# Patient Record
Sex: Male | Born: 1975
Health system: Southern US, Community
[De-identification: ages and names within clinical notes are randomized; demographics above are authoritative.]

## PROBLEM LIST (undated history)

## (undated) DIAGNOSIS — E119 Type 2 diabetes mellitus without complications: Secondary | ICD-10-CM

## (undated) DIAGNOSIS — I10 Essential (primary) hypertension: Secondary | ICD-10-CM

## (undated) HISTORY — PX: APPENDECTOMY: SHX54

## (undated) HISTORY — PX: KNEE ARTHROSCOPY: SUR90

## (undated) HISTORY — PX: FOOT SURGERY: SHX648

## (undated) HISTORY — PX: CHOLECYSTECTOMY: SHX55

---

## 1999-04-13 HISTORY — PX: ARTHROSCOPIC REPAIR ACL: SUR80

## 1999-10-05 ENCOUNTER — Ambulatory Visit (HOSPITAL_COMMUNITY): Admission: RE | Admit: 1999-10-05 | Discharge: 1999-10-05 | Payer: Self-pay | Admitting: Orthopedic Surgery

## 2003-10-04 ENCOUNTER — Ambulatory Visit (HOSPITAL_COMMUNITY): Admission: RE | Admit: 2003-10-04 | Discharge: 2003-10-04 | Payer: Self-pay | Admitting: Podiatry

## 2004-06-04 ENCOUNTER — Ambulatory Visit: Payer: Self-pay | Admitting: Cardiology

## 2010-08-24 DIAGNOSIS — R079 Chest pain, unspecified: Secondary | ICD-10-CM

## 2010-08-26 DIAGNOSIS — R072 Precordial pain: Secondary | ICD-10-CM

## 2012-09-22 DIAGNOSIS — R55 Syncope and collapse: Secondary | ICD-10-CM

## 2014-08-30 ENCOUNTER — Emergency Department (HOSPITAL_COMMUNITY)
Admission: EM | Admit: 2014-08-30 | Discharge: 2014-08-30 | Disposition: A | Discharge status: Home / Self Care | Payer: BLUE CROSS/BLUE SHIELD | Attending: Emergency Medicine | Admitting: Emergency Medicine

## 2014-08-30 ENCOUNTER — Encounter (HOSPITAL_COMMUNITY): Payer: Self-pay | Admitting: Emergency Medicine

## 2014-08-30 ENCOUNTER — Emergency Department (HOSPITAL_COMMUNITY): Payer: BLUE CROSS/BLUE SHIELD

## 2014-08-30 DIAGNOSIS — Z794 Long term (current) use of insulin: Secondary | ICD-10-CM | POA: Insufficient documentation

## 2014-08-30 DIAGNOSIS — R109 Unspecified abdominal pain: Secondary | ICD-10-CM | POA: Diagnosis present

## 2014-08-30 DIAGNOSIS — I10 Essential (primary) hypertension: Secondary | ICD-10-CM | POA: Insufficient documentation

## 2014-08-30 DIAGNOSIS — R110 Nausea: Secondary | ICD-10-CM | POA: Diagnosis not present

## 2014-08-30 DIAGNOSIS — E119 Type 2 diabetes mellitus without complications: Secondary | ICD-10-CM | POA: Insufficient documentation

## 2014-08-30 DIAGNOSIS — Z79899 Other long term (current) drug therapy: Secondary | ICD-10-CM | POA: Diagnosis not present

## 2014-08-30 DIAGNOSIS — Z720 Tobacco use: Secondary | ICD-10-CM | POA: Insufficient documentation

## 2014-08-30 DIAGNOSIS — M549 Dorsalgia, unspecified: Secondary | ICD-10-CM | POA: Insufficient documentation

## 2014-08-30 HISTORY — DX: Essential (primary) hypertension: I10

## 2014-08-30 HISTORY — DX: Type 2 diabetes mellitus without complications: E11.9

## 2014-08-30 LAB — CBC WITH DIFFERENTIAL/PLATELET
Basophils Absolute: 0 10*3/uL (ref 0.0–0.1)
Basophils Relative: 1 % (ref 0–1)
Eosinophils Absolute: 0.4 10*3/uL (ref 0.0–0.7)
Eosinophils Relative: 5 % (ref 0–5)
HCT: 41.9 % (ref 39.0–52.0)
Hemoglobin: 14.7 g/dL (ref 13.0–17.0)
Lymphocytes Relative: 27 % (ref 12–46)
Lymphs Abs: 2.2 10*3/uL (ref 0.7–4.0)
MCH: 30.5 pg (ref 26.0–34.0)
MCHC: 35.1 g/dL (ref 30.0–36.0)
MCV: 86.9 fL (ref 78.0–100.0)
Monocytes Absolute: 0.4 10*3/uL (ref 0.1–1.0)
Monocytes Relative: 5 % (ref 3–12)
Neutro Abs: 5 10*3/uL (ref 1.7–7.7)
Neutrophils Relative %: 62 % (ref 43–77)
Platelets: 269 10*3/uL (ref 150–400)
RBC: 4.82 MIL/uL (ref 4.22–5.81)
RDW: 12.5 % (ref 11.5–15.5)
WBC: 8 10*3/uL (ref 4.0–10.5)

## 2014-08-30 LAB — URINE MICROSCOPIC-ADD ON

## 2014-08-30 LAB — BASIC METABOLIC PANEL
Anion gap: 11 (ref 5–15)
BUN: 15 mg/dL (ref 6–20)
CO2: 26 mmol/L (ref 22–32)
Calcium: 9 mg/dL (ref 8.9–10.3)
Chloride: 100 mmol/L — ABNORMAL LOW (ref 101–111)
Creatinine, Ser: 0.96 mg/dL (ref 0.61–1.24)
GFR calc Af Amer: >60 mL/min (ref >60)
GFR calc non Af Amer: >60 mL/min (ref >60)
Glucose, Bld: 364 mg/dL — ABNORMAL HIGH (ref 65–99)
Potassium: 4 mmol/L (ref 3.5–5.1)
Sodium: 137 mmol/L (ref 135–145)

## 2014-08-30 LAB — URINALYSIS, ROUTINE W REFLEX MICROSCOPIC
Bilirubin Urine: NEGATIVE
Glucose, UA: >1000 mg/dL — AB
Hgb urine dipstick: NEGATIVE
Ketones, ur: 15 mg/dL — AB
Leukocytes, UA: NEGATIVE
Nitrite: NEGATIVE
Protein, ur: NEGATIVE mg/dL
Specific Gravity, Urine: 1.02 (ref 1.005–1.030)
Urobilinogen, UA: 0.2 mg/dL (ref 0.0–1.0)
pH: 6 (ref 5.0–8.0)

## 2014-08-30 LAB — CBG MONITORING, ED: Glucose-Capillary: 326 mg/dL — ABNORMAL HIGH (ref 65–99)

## 2014-08-30 MED ORDER — NAPROXEN 500 MG PO TABS: 500.0000 mg | ORAL_TABLET | Freq: Two times a day (BID) | ORAL | Status: DC

## 2014-08-30 MED ORDER — FENTANYL CITRATE (PF) 100 MCG/2ML IJ SOLN
50.0000 ug | Freq: Once | INTRAMUSCULAR | Status: AC
  Administered 2014-08-30: 50 ug via INTRAVENOUS
  Filled 2014-08-30: qty 2

## 2014-08-30 MED ORDER — SODIUM CHLORIDE 0.9 % IV BOLUS (SEPSIS)
500.0000 mL | Freq: Once | INTRAVENOUS | Status: AC
  Administered 2014-08-30: 500 mL via INTRAVENOUS

## 2014-08-30 MED ORDER — HYDROCODONE-ACETAMINOPHEN 5-325 MG PO TABS: 1.0000 | ORAL_TABLET | Freq: Four times a day (QID) | ORAL | Status: DC | PRN

## 2014-08-30 MED ORDER — ONDANSETRON HCL 4 MG/2ML IJ SOLN
4.0000 mg | Freq: Once | INTRAMUSCULAR | Status: AC
  Administered 2014-08-30: 4 mg via INTRAVENOUS
  Filled 2014-08-30: qty 2

## 2014-08-30 MED ORDER — ONDANSETRON 4 MG PO TBDP: 4.0000 mg | ORAL_TABLET | Freq: Three times a day (TID) | ORAL | Status: DC | PRN

## 2014-08-30 MED ORDER — SODIUM CHLORIDE 0.9 % IV SOLN
INTRAVENOUS | Status: DC
  Administered 2014-08-30: 14:00:00 via INTRAVENOUS

## 2014-08-30 NOTE — Discharge Instructions (Signed)
Workup for the flank pain without any specific findings. CT scan had an incidental finding of a floppy sigmoid colon if you're to develop severe pain in the lower part of the abdomen it would be important to get seen right away. There is no evidence of problem but that today. Take the Naprosyn on a regular basis take the pain medicine as needed and take the antinausea medicine as needed. Work note provided to be off until Monday. Follow-up with your doctor if not improved return for any new or worse symptoms.

## 2014-08-30 NOTE — ED Notes (Signed)
Pt reports right flank pain since last night. Pt reports pain increased around 11am this am. Pt reports nausea and urinary urgency. nad noted.

## 2014-08-30 NOTE — ED Provider Notes (Signed)
CSN: [id number]     Arrival date & time 08/30/14  1310 History   First MD Initiated Contact with Patient 08/30/14 1340     Chief Complaint  Patient presents with  . Flank Pain     (Consider location/radiation/quality/duration/timing/severity/associated sxs/prior Treatment) Patient is a 39 y.o. male presenting with flank pain. The history is provided by the patient.  Flank Pain Pertinent negatives include no chest pain, no abdominal pain, no headaches and no shortness of breath.   patient with onset of some mild right CVA back discomfort last evening. Today at around 11:00 in the morning with significant right flank pain. 8 out of 10. Associated with nausea but no vomiting no blood in the urine. No history of any prior similar pain no history of kidney stones.  Past Medical History  Diagnosis Date  . Hypertension   . Diabetes mellitus without complication    Past Surgical History  Procedure Laterality Date  . Knee arthroscopy    . Arthroscopic repair acl Right 2001  . Foot surgery    . Appendectomy    . Cholecystectomy     History reviewed. No pertinent family history. History  Substance Use Topics  . Smoking status: Light Tobacco Smoker  . Smokeless tobacco: Current User    Types: Chew  . Alcohol Use: No    Review of Systems  Constitutional: Negative for fever.  HENT: Negative for congestion.   Respiratory: Negative for shortness of breath.   Cardiovascular: Negative for chest pain.  Gastrointestinal: Positive for nausea. Negative for vomiting and abdominal pain.  Genitourinary: Positive for flank pain. Negative for dysuria and hematuria.  Musculoskeletal: Positive for back pain.  Skin: Negative for rash.  Neurological: Negative for headaches.  Hematological: Does not bruise/bleed easily.  Psychiatric/Behavioral: Negative for confusion.      Allergies  Review of patient's allergies indicates not on file.  Home Medications   Prior to Admission medications    Medication Sig Start Date End Date Taking? Authorizing Provider  atenolol (TENORMIN) 25 MG tablet Take 1 tablet by mouth daily. 06/08/14  Yes Historical Provider, MD  fexofenadine (ALLEGRA) 180 MG tablet Take 180 mg by mouth daily.   Yes Historical Provider, MD  ibuprofen (ADVIL,MOTRIN) 200 MG tablet Take 400-600 mg by mouth every 6 (six) hours as needed for headache.   Yes Historical Provider, MD  insulin aspart (NOVOLOG) 100 UNIT/ML injection Inject 2-10 Units into the skin 3 (three) times daily before meals. Per sliding scale.   Yes Historical Provider, MD  Insulin Glargine 300 UNIT/ML SOPN Inject 30 Units into the skin at bedtime.   Yes Historical Provider, MD  PROAIR HFA 108 (90 BASE) MCG/ACT inhaler Inhale 1 puff into the lungs daily as needed. 08/10/14  Yes Historical Provider, MD  HYDROcodone-acetaminophen (NORCO/VICODIN) 5-325 MG per tablet Take 1-2 tablets by mouth every 6 (six) hours as needed. 08/30/14   Vanetta MuldersScott Lamanda Rudder, MD  naproxen (NAPROSYN) 500 MG tablet Take 1 tablet (500 mg total) by mouth 2 (two) times daily. 08/30/14   Vanetta MuldersScott Tanner Vigna, MD  ondansetron (ZOFRAN ODT) 4 MG disintegrating tablet Take 1 tablet (4 mg total) by mouth every 8 (eight) hours as needed. 08/30/14   Vanetta MuldersScott Adalbert Alberto, MD   BP 132/74 mmHg  Pulse 64  Temp(Src) 97.9 F (36.6 C) (Oral)  Resp 18  Ht 6\' 6"  (1.981 m)  Wt 248 lb (112.492 kg)  BMI 28.67 kg/m2  SpO2 100% Physical Exam  Constitutional: He is oriented to person, place, and time.  He appears well-developed and well-nourished. No distress.  HENT:  Head: Normocephalic and atraumatic.  Mouth/Throat: No oropharyngeal exudate.  Eyes: Conjunctivae and EOM are normal. Pupils are equal, round, and reactive to light.  Neck: Normal range of motion. Neck supple.  Cardiovascular: Normal rate, regular rhythm and normal heart sounds.   No murmur heard. Pulmonary/Chest: Effort normal and breath sounds normal. No respiratory distress.  Abdominal: Soft. Bowel sounds  are normal. There is no tenderness.  Musculoskeletal: Normal range of motion.  Neurological: He is alert and oriented to person, place, and time. No cranial nerve deficit. He exhibits normal muscle tone. Coordination normal.  Skin: Skin is warm. No rash noted.  Nursing note and vitals reviewed.   ED Course  Procedures (including critical care time) Labs Review Labs Reviewed  URINALYSIS, ROUTINE W REFLEX MICROSCOPIC - Abnormal; Notable for the following:    Glucose, UA >1000 (*)    Ketones, ur 15 (*)    All other components within normal limits  BASIC METABOLIC PANEL - Abnormal; Notable for the following:    Chloride 100 (*)    Glucose, Bld 364 (*)    All other components within normal limits  CBG MONITORING, ED - Abnormal; Notable for the following:    Glucose-Capillary 326 (*)    All other components within normal limits  CBC WITH DIFFERENTIAL/PLATELET  URINE MICROSCOPIC-ADD ON    Imaging Review Ct Renal Stone Study  08/30/2014   CLINICAL DATA:  Right flank pain starting today, status post appendectomy, status postcholecystectomy.  EXAM: CT ABDOMEN AND PELVIS WITHOUT CONTRAST  TECHNIQUE: Multidetector CT imaging of the abdomen and pelvis was performed following the standard protocol without IV contrast.  COMPARISON:  None.  FINDINGS: Sagittal images of the spine shows disc space flattening with vacuum disc phenomenon at L5-S1 level. The lung bases are unremarkable.  The patient is status postcholecystectomy. Unenhanced liver shows no biliary ductal dilatation. Unenhanced pancreas, spleen and adrenal glands are unremarkable. Unenhanced kidneys are symmetrical in size. No aortic aneurysm. No nephrolithiasis. No hydronephrosis or hydroureter. No calcified ureteral calculi are noted.  No small bowel obstruction. No ascites or free air. No adenopathy. Moderate stool and gas noted in transverse colon right colon and descending colon.  There is a redundant descending colon and especially  sigmoid colon. The sigmoid colon is looping in right mid abdomen moderate distended with gas. There is some narrowing of the lumen of proximal sigmoid colon in axial image 53 and 54 without definite evidence of colonic obstruction. Please note this circular segment of the colon is surrounding mild rotational pattern of mesenteric vessels as seen in axial image 54. There is no definite evidence of internal hernia or mesenteric edema or fluid. Clinical correlation is necessary. If the patient has persistent colic like abdominal pain follow-up enhanced study could be performed as clinically warranted.  IMPRESSION: 1. There is no nephrolithiasis.  No hydronephrosis or hydroureter. 2. Status post appendectomy. 3. There is redundant sigmoid colon moderate distended with gas. There is circular short segment mild narrowing of the lumen in proximal sigmoid colon surrounding a rotational pattern of mesenteric vessels please see axial image 54. There is no definite evidence of colonic obstruction or mesenteric edema. No definite evidence of colonic volvulus. No definite evidence of internal hernia. If the patient has persistent colic like abdominal pain follow-up enhanced study with IV and oral contrast could be performed as clinically warranted. 4. No small bowel obstruction. These results were called by telephone at the time  of interpretation on 08/30/2014 at 4:08 pm to Dr. Vanetta Mulders , who verbally acknowledged these results.   Electronically Signed   By: Natasha Mead M.D.   On: 08/30/2014 16:08     EKG Interpretation None      MDM   Final diagnoses:  Right flank pain    Patient with runoff onset of some right CVA back pain last night today with onset of pretty significant right flank pain. Workup negative for any evidence of a kidney stone or any acute abnormality. Urinalysis negative for urinary tract infection renal function is normal. Incidental finding on the CT was that patient has a redundant somewhat  floppy sigmoid colon however there is no evidence of any volvulus or acute problem with the sigmoid colon today. Patient made aware of this finding in case any severe pain develops in the future. Patient will be treated symptomatically for the flank pain.   Vanetta Mulders, MD 08/30/14 1630

## 2016-01-02 ENCOUNTER — Encounter (HOSPITAL_COMMUNITY): Payer: Self-pay | Admitting: *Deleted

## 2016-01-02 ENCOUNTER — Emergency Department (HOSPITAL_COMMUNITY): Payer: BLUE CROSS/BLUE SHIELD

## 2016-01-02 ENCOUNTER — Emergency Department (HOSPITAL_COMMUNITY)
Admission: EM | Admit: 2016-01-02 | Discharge: 2016-01-02 | Disposition: A | Discharge status: Home / Self Care | Payer: BLUE CROSS/BLUE SHIELD | Attending: Emergency Medicine | Admitting: Emergency Medicine

## 2016-01-02 DIAGNOSIS — I10 Essential (primary) hypertension: Secondary | ICD-10-CM | POA: Insufficient documentation

## 2016-01-02 DIAGNOSIS — E119 Type 2 diabetes mellitus without complications: Secondary | ICD-10-CM | POA: Diagnosis not present

## 2016-01-02 DIAGNOSIS — F17220 Nicotine dependence, chewing tobacco, uncomplicated: Secondary | ICD-10-CM | POA: Insufficient documentation

## 2016-01-02 DIAGNOSIS — Z79899 Other long term (current) drug therapy: Secondary | ICD-10-CM | POA: Diagnosis not present

## 2016-01-02 DIAGNOSIS — R0789 Other chest pain: Secondary | ICD-10-CM | POA: Diagnosis not present

## 2016-01-02 DIAGNOSIS — R072 Precordial pain: Secondary | ICD-10-CM | POA: Diagnosis present

## 2016-01-02 LAB — BASIC METABOLIC PANEL
Anion gap: 9 (ref 5–15)
BUN: 11 mg/dL (ref 6–20)
CO2: 28 mmol/L (ref 22–32)
Calcium: 9.4 mg/dL (ref 8.9–10.3)
Chloride: 99 mmol/L — ABNORMAL LOW (ref 101–111)
Creatinine, Ser: 0.76 mg/dL (ref 0.61–1.24)
GFR calc Af Amer: >60 mL/min (ref >60)
GFR calc non Af Amer: >60 mL/min (ref >60)
Glucose, Bld: 350 mg/dL — ABNORMAL HIGH (ref 65–99)
Potassium: 3.5 mmol/L (ref 3.5–5.1)
Sodium: 136 mmol/L (ref 135–145)

## 2016-01-02 LAB — CBC
HCT: 44.9 % (ref 39.0–52.0)
Hemoglobin: 16.2 g/dL (ref 13.0–17.0)
MCH: 30.9 pg (ref 26.0–34.0)
MCHC: 36.1 g/dL — ABNORMAL HIGH (ref 30.0–36.0)
MCV: 85.7 fL (ref 78.0–100.0)
Platelets: 280 10*3/uL (ref 150–400)
RBC: 5.24 MIL/uL (ref 4.22–5.81)
RDW: 12.7 % (ref 11.5–15.5)
WBC: 6.7 10*3/uL (ref 4.0–10.5)

## 2016-01-02 LAB — TROPONIN I: Troponin I: <0.03 ng/mL (ref <0.03)

## 2016-01-02 NOTE — ED Provider Notes (Signed)
[facility] Provider Note   CSN: [id number] Arrival date & time: 01/02/16  1556     History   Chief Complaint Chief Complaint  Patient presents with  . Chest Pain    HPI Raymond Sanchez is a 40 y.o. male.  Patient states that he's been having some chest tightness off and on recently. Not related to exertion. His wife states that he's been under a lot of stress lately because they are recently separated   The history is provided by the patient. No language interpreter was used.  Chest Pain   This is a new problem. The current episode started more than 2 days ago. The problem occurs rarely. The problem has been resolved. Associated with: Nothing. The pain is present in the substernal region. The pain is at a severity of 3/10. The pain is mild. The quality of the pain is described as brief. The pain does not radiate. Pertinent negatives include no abdominal pain, no back pain, no cough and no headaches.  Pertinent negatives for past medical history include no seizures.    Past Medical History:  Diagnosis Date  . Diabetes mellitus without complication (HCC)   . Hypertension     There are no active problems to display for this patient.   Past Surgical History:  Procedure Laterality Date  . APPENDECTOMY    . ARTHROSCOPIC REPAIR ACL Right 2001  . CHOLECYSTECTOMY    . FOOT SURGERY    . KNEE ARTHROSCOPY         Home Medications    Prior to Admission medications   Medication Sig Start Date End Date Taking? Authorizing Provider  DULoxetine (CYMBALTA) 60 MG capsule Take 60 mg by mouth daily.  10/23/15  Yes Historical Provider, MD  fexofenadine (ALLEGRA) 180 MG tablet Take 180 mg by mouth daily.   Yes Historical Provider, MD  PROAIR HFA 108 (90 BASE) MCG/ACT inhaler Inhale 1 puff into the lungs every 4 (four) hours as needed for wheezing or shortness of breath.  08/10/14  Yes Historical Provider, MD    Family History No family history on file.  Social  History Social History  Substance Use Topics  . Smoking status: Light Tobacco Smoker  . Smokeless tobacco: Current User    Types: Chew  . Alcohol use Yes     Comment: occ.      Allergies   Review of patient's allergies indicates no known allergies.   Review of Systems Review of Systems  Constitutional: Negative for appetite change and fatigue.  HENT: Negative for congestion, ear discharge and sinus pressure.   Eyes: Negative for discharge.  Respiratory: Negative for cough.   Cardiovascular: Positive for chest pain.  Gastrointestinal: Negative for abdominal pain and diarrhea.  Genitourinary: Negative for frequency and hematuria.  Musculoskeletal: Negative for back pain.  Skin: Negative for rash.  Neurological: Negative for seizures and headaches.  Psychiatric/Behavioral: Negative for hallucinations.     Physical Exam Updated Vital Signs BP 125/89   Pulse 77   Temp 98.4 F (36.9 C) (Oral)   Resp (!) 9   Ht 6\' 5"  (1.956 m)   Wt 225 lb (102.1 kg)   SpO2 97%   BMI 26.68 kg/m   Physical Exam  Constitutional: He is oriented to person, place, and time. He appears well-developed.  HENT:  Head: Normocephalic.  Eyes: Conjunctivae and EOM are normal. No scleral icterus.  Neck: Neck supple. No thyromegaly present.  Cardiovascular: Normal rate and regular rhythm.  Exam reveals no  gallop and no friction rub.   No murmur heard. Pulmonary/Chest: No stridor. He has no wheezes. He has no rales. He exhibits no tenderness.  Abdominal: He exhibits no distension. There is no tenderness. There is no rebound.  Musculoskeletal: Normal range of motion. He exhibits no edema.  Lymphadenopathy:    He has no cervical adenopathy.  Neurological: He is oriented to person, place, and time. He exhibits normal muscle tone. Coordination normal.  Skin: No rash noted. No erythema.  Psychiatric: He has a normal mood and affect. His behavior is normal.     ED Treatments / Results  Labs (all  labs ordered are listed, but only abnormal results are displayed) Labs Reviewed  BASIC METABOLIC PANEL - Abnormal; Notable for the following:       Result Value   Chloride 99 (*)    Glucose, Bld 350 (*)    All other components within normal limits  CBC - Abnormal; Notable for the following:    MCHC 36.1 (*)    All other components within normal limits  TROPONIN I    EKG  EKG Interpretation  Date/Time:  Friday January 02 2016 16:08:19 EDT Ventricular Rate:  74 PR Interval:  156 QRS Duration: 100 QT Interval:  372 QTC Calculation: 412 R Axis:   85 Text Interpretation:  Normal sinus rhythm Normal ECG Confirmed by Brinton Brandel  MD, Reesa Gotschall [id number]) on 01/02/2016 9:18:03 PM       Radiology Dg Chest 2 View  Result Date: 01/02/2016 CLINICAL DATA:  Chest pain. EXAM: CHEST  2 VIEW COMPARISON:  02/14/2016. FINDINGS: Mediastinum hilar structures normal. Lungs are clear. Heart size normal. No pleural effusion or pneumothorax. Degenerative changes thoracic spine. IMPRESSION: No acute cardiopulmonary disease. Electronically Signed   By: Maisie Fus  Register   On: 01/02/2016 17:09    Procedures Procedures (including critical care time)  Medications Ordered in ED Medications - No data to display   Initial Impression / Assessment and Plan / ED Course  I have reviewed the triage vital signs and the nursing notes.  Pertinent labs & imaging results that were available during my care of the patient were reviewed by me and considered in my medical decision making (see chart for details).  Clinical Course    Labs including troponin normal. EKG unremarkable chest x-ray normal. Doubt chest pain is coronary artery related. Patient will be discharged home will follow-up with PCP Final Clinical Impressions(s) / ED Diagnoses   Final diagnoses:  Other chest pain    New Prescriptions New Prescriptions   No medications on file     Bethann Berkshire, MD 01/02/16 2126

## 2016-01-02 NOTE — Discharge Instructions (Signed)
Follow-up with her family doctor next week for recheck as planned. Return if any problems
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
## 2016-01-02 NOTE — ED Triage Notes (Signed)
Pt comes in with intermittent chest pain starting on Tuesday. Pt states he has intermittent nausea, denies vomiting. Denies any pain at this time.

## 2016-06-18 DIAGNOSIS — E119 Type 2 diabetes mellitus without complications: Secondary | ICD-10-CM | POA: Diagnosis not present

## 2016-06-18 DIAGNOSIS — K219 Gastro-esophageal reflux disease without esophagitis: Secondary | ICD-10-CM | POA: Diagnosis not present

## 2016-06-18 DIAGNOSIS — Z6826 Body mass index (BMI) 26.0-26.9, adult: Secondary | ICD-10-CM | POA: Diagnosis not present

## 2016-06-18 DIAGNOSIS — R079 Chest pain, unspecified: Secondary | ICD-10-CM | POA: Diagnosis not present

## 2016-06-18 DIAGNOSIS — Z1389 Encounter for screening for other disorder: Secondary | ICD-10-CM | POA: Diagnosis not present

## 2016-06-18 DIAGNOSIS — Z6829 Body mass index (BMI) 29.0-29.9, adult: Secondary | ICD-10-CM | POA: Diagnosis not present

## 2017-02-11 DIAGNOSIS — E119 Type 2 diabetes mellitus without complications: Secondary | ICD-10-CM | POA: Diagnosis not present

## 2017-02-11 DIAGNOSIS — K219 Gastro-esophageal reflux disease without esophagitis: Secondary | ICD-10-CM | POA: Diagnosis not present

## 2017-02-11 DIAGNOSIS — Z23 Encounter for immunization: Secondary | ICD-10-CM | POA: Diagnosis not present

## 2017-02-11 DIAGNOSIS — Z6831 Body mass index (BMI) 31.0-31.9, adult: Secondary | ICD-10-CM | POA: Diagnosis not present

## 2017-09-17 ENCOUNTER — Encounter (HOSPITAL_COMMUNITY): Payer: Self-pay

## 2017-09-17 ENCOUNTER — Ambulatory Visit (HOSPITAL_COMMUNITY)
Admission: EM | Admit: 2017-09-17 | Discharge: 2017-09-17 | Disposition: A | Discharge status: Home / Self Care | Payer: 59 | Attending: Internal Medicine | Admitting: Internal Medicine

## 2017-09-17 DIAGNOSIS — R1011 Right upper quadrant pain: Secondary | ICD-10-CM | POA: Diagnosis not present

## 2017-09-17 DIAGNOSIS — M6208 Separation of muscle (nontraumatic), other site: Secondary | ICD-10-CM

## 2017-09-17 MED ORDER — NAPROXEN 375 MG PO TABS: 375.0000 mg | ORAL_TABLET | Freq: Two times a day (BID) | ORAL | 0 refills | Status: DC | PRN

## 2017-09-17 NOTE — ED Triage Notes (Signed)
Pt presents with complaints of RUQ pain worse with meals.

## 2017-09-17 NOTE — Discharge Instructions (Addendum)
Get rest and drink fluids Naprosyn prescribed.  Take as needed for symptomatic relief Follow up with PCP next week for further evaluation and management Return or go to the ER if you have any new or worsening symptoms

## 2017-09-17 NOTE — ED Provider Notes (Signed)
[facility]   [id number] 09/17/17 Arrival Time: 1536  SUBJECTIVE:  Linus GalasDonald R Starzyk is a 42 y.o. male hx of DM and HTN who presents with complaint of worsening abdominal discomfort that began abruptly 1.5 -2 weeks ago.  States his symptoms began after repeatedly trying to attach a trailer to his truck [employer].  Localizes pain to RUQ.  Describes as constant and burning, stabbing, twisting in character.  Has tried advil without relief.  Reports bending over with worsening symptoms.  Denies similar symptoms in the past.  Last BM yesterday, with straining. Complains of constipation.    Denies fever, chills, appetite changes, weight changes, nausea, vomiting, chest pain, SOB, diarrhea, hematochezia, melena, dysuria, difficulty urinating, increased frequency or urgency, flank pain, loss of bowel or bladder function.  Blood glucose has been WNL.  Surgical hx significant for cholecystectomy and appendectomy  No LMP for male patient.  ROS: As per HPI.  Past Medical History:  Diagnosis Date  . Diabetes mellitus without complication (HCC)   . Hypertension    Past Surgical History:  Procedure Laterality Date  . APPENDECTOMY    . ARTHROSCOPIC REPAIR ACL Right 2001  . CHOLECYSTECTOMY    . FOOT SURGERY    . KNEE ARTHROSCOPY     No Known Allergies No current facility-administered medications on file prior to encounter.    Current Outpatient Medications on File Prior to Encounter  Medication Sig Dispense Refill  . fexofenadine (ALLEGRA) 180 MG tablet Take 180 mg by mouth daily.    . DULoxetine (CYMBALTA) 60 MG capsule Take 60 mg by mouth daily.   0  . PROAIR HFA 108 (90 BASE) MCG/ACT inhaler Inhale 1 puff into the lungs every 4 (four) hours as needed for wheezing or shortness of breath.   4   Social History   Socioeconomic History  . Marital status: Single    Spouse name: Not on file  . Number of children: Not on file  . Years of education: Not on file  . Highest education level:  Not on file  Occupational History  . Not on file  Social Needs  . Financial resource strain: Not on file  . Food insecurity:    Worry: Not on file    Inability: Not on file  . Transportation needs:    Medical: Not on file    Non-medical: Not on file  Tobacco Use  . Smoking status: Light Tobacco Smoker  . Smokeless tobacco: Current User    Types: Chew  Substance and Sexual Activity  . Alcohol use: Yes    Comment: occ.   . Drug use: No  . Sexual activity: Not on file  Lifestyle  . Physical activity:    Days per week: Not on file    Minutes per session: Not on file  . Stress: Not on file  Relationships  . Social connections:    Talks on phone: Not on file    Gets together: Not on file    Attends religious service: Not on file    Active member of club or organization: Not on file    Attends meetings of clubs or organizations: Not on file    Relationship status: Not on file  . Intimate partner violence:    Fear of current or ex partner: Not on file    Emotionally abused: Not on file    Physically abused: Not on file    Forced sexual activity: Not on file  Other Topics Concern  . Not  on file  Social History Narrative  . Not on file   History reviewed. No pertinent family history.   OBJECTIVE:  Vitals:   09/17/17 1603  BP: (!) 141/87  Pulse: 78  Resp: 18  Temp: 98 F (36.7 C)  SpO2: 100%    General appearance: AOx3 in no acute distress HEENT: NCAT.  Oropharynx clear.  Lungs: clear to auscultation bilaterally without adventitious breath sounds Heart: regular rate and rhythm.  Radial pulses 2+ symmetrical bilaterally Abdomen: soft, non-distended; diastasis appreciated while patient flexing abdominal muscles; normal active bowel sounds; tender to palpation about the RUQ; negative rebound; no guarding;  Back: no CVA tenderness Extremities: no edema; symmetrical with no gross deformities Skin: warm and dry Neurologic: normal gait Psychological: alert and  cooperative; normal mood and affect  ASSESSMENT & PLAN:  1. Abdominal pain, right upper quadrant   2. Rectus diastasis of lower abdomen     Meds ordered this encounter  Medications  . naproxen (NAPROSYN) 375 MG tablet    Sig: Take 1 tablet (375 mg total) by mouth 2 (two) times daily as needed for moderate pain.    Dispense:  20 tablet    Refill:  0    Order Specific Question:   Supervising Provider    Answer:   Isa Rankin [[id number]]   Get rest and drink fluids Naprosyn prescribed.  Take as needed for symptomatic relief Follow up with PCP next week for further evaluation and management Return or go to the ER if you have any new or worsening symptoms  Reviewed expectations re: course of current medical issues. Questions answered. Outlined signs and symptoms indicating need for more acute intervention. Patient verbalized understanding. After Visit Summary given.   Rennis Harding, PA-C 09/17/17 1742

## 2017-09-21 ENCOUNTER — Encounter (HOSPITAL_COMMUNITY): Payer: Self-pay

## 2017-09-21 ENCOUNTER — Emergency Department (HOSPITAL_COMMUNITY): Payer: 59

## 2017-09-21 ENCOUNTER — Emergency Department (HOSPITAL_COMMUNITY)
Admission: EM | Admit: 2017-09-21 | Discharge: 2017-09-21 | Disposition: A | Discharge status: Home / Self Care | Payer: 59 | Attending: Emergency Medicine | Admitting: Emergency Medicine

## 2017-09-21 ENCOUNTER — Other Ambulatory Visit: Payer: Self-pay

## 2017-09-21 DIAGNOSIS — R1011 Right upper quadrant pain: Secondary | ICD-10-CM | POA: Insufficient documentation

## 2017-09-21 DIAGNOSIS — F17200 Nicotine dependence, unspecified, uncomplicated: Secondary | ICD-10-CM | POA: Insufficient documentation

## 2017-09-21 DIAGNOSIS — R109 Unspecified abdominal pain: Secondary | ICD-10-CM | POA: Diagnosis not present

## 2017-09-21 DIAGNOSIS — E1165 Type 2 diabetes mellitus with hyperglycemia: Secondary | ICD-10-CM | POA: Diagnosis not present

## 2017-09-21 DIAGNOSIS — Z794 Long term (current) use of insulin: Secondary | ICD-10-CM | POA: Diagnosis not present

## 2017-09-21 DIAGNOSIS — R739 Hyperglycemia, unspecified: Secondary | ICD-10-CM

## 2017-09-21 DIAGNOSIS — I10 Essential (primary) hypertension: Secondary | ICD-10-CM | POA: Diagnosis not present

## 2017-09-21 LAB — URINALYSIS, ROUTINE W REFLEX MICROSCOPIC
Bacteria, UA: NONE SEEN
Bilirubin Urine: NEGATIVE
Glucose, UA: >=500 mg/dL — AB
Hgb urine dipstick: NEGATIVE
Ketones, ur: 80 mg/dL — AB
Leukocytes, UA: NEGATIVE
Nitrite: NEGATIVE
Protein, ur: NEGATIVE mg/dL
Specific Gravity, Urine: >1.046 — ABNORMAL HIGH (ref 1.005–1.030)
pH: 7 (ref 5.0–8.0)

## 2017-09-21 LAB — CBC WITH DIFFERENTIAL/PLATELET
Basophils Absolute: 0 10*3/uL (ref 0.0–0.1)
Basophils Relative: 0 %
Eosinophils Absolute: 0.2 10*3/uL (ref 0.0–0.7)
Eosinophils Relative: 5 %
HCT: 46.7 % (ref 39.0–52.0)
Hemoglobin: 16.2 g/dL (ref 13.0–17.0)
Lymphocytes Relative: 25 %
Lymphs Abs: 1.3 10*3/uL (ref 0.7–4.0)
MCH: 30.3 pg (ref 26.0–34.0)
MCHC: 34.7 g/dL (ref 30.0–36.0)
MCV: 87.3 fL (ref 78.0–100.0)
Monocytes Absolute: 0.4 10*3/uL (ref 0.1–1.0)
Monocytes Relative: 8 %
Neutro Abs: 3.3 10*3/uL (ref 1.7–7.7)
Neutrophils Relative %: 62 %
Platelets: 238 10*3/uL (ref 150–400)
RBC: 5.35 MIL/uL (ref 4.22–5.81)
RDW: 12.5 % (ref 11.5–15.5)
WBC: 5.2 10*3/uL (ref 4.0–10.5)

## 2017-09-21 LAB — COMPREHENSIVE METABOLIC PANEL WITH GFR
ALT: 27 U/L (ref 17–63)
AST: 15 U/L (ref 15–41)
Albumin: 4.4 g/dL (ref 3.5–5.0)
Alkaline Phosphatase: 81 U/L (ref 38–126)
Anion gap: 11 (ref 5–15)
BUN: 13 mg/dL (ref 6–20)
CO2: 27 mmol/L (ref 22–32)
Calcium: 9.5 mg/dL (ref 8.9–10.3)
Chloride: 98 mmol/L — ABNORMAL LOW (ref 101–111)
Creatinine, Ser: 0.82 mg/dL (ref 0.61–1.24)
GFR calc Af Amer: >60 mL/min (ref >60)
GFR calc non Af Amer: >60 mL/min (ref >60)
Glucose, Bld: 337 mg/dL — ABNORMAL HIGH (ref 65–99)
Potassium: 4.5 mmol/L (ref 3.5–5.1)
Sodium: 136 mmol/L (ref 135–145)
Total Bilirubin: 1.2 mg/dL (ref 0.3–1.2)
Total Protein: 7.5 g/dL (ref 6.5–8.1)

## 2017-09-21 LAB — LIPASE, BLOOD: Lipase: 30 U/L (ref 11–51)

## 2017-09-21 MED ORDER — SODIUM CHLORIDE 0.9 % IV BOLUS: 1000.0000 mL | Freq: Once | INTRAVENOUS | Status: DC

## 2017-09-21 MED ORDER — IOPAMIDOL (ISOVUE-300) INJECTION 61%
100.0000 mL | Freq: Once | INTRAVENOUS | Status: AC | PRN
  Administered 2017-09-21: 100 mL via INTRAVENOUS

## 2017-09-21 MED ORDER — HYDROCODONE-ACETAMINOPHEN 5-325 MG PO TABS: 1.0000 | ORAL_TABLET | ORAL | 0 refills | Status: DC | PRN

## 2017-09-21 NOTE — Discharge Instructions (Signed)
Your labs and your CT scan today are negative for any acute source of your right upper quadrant pain.  As discussed your blood glucose level is elevated today, make sure you are taking your diabetes medications.  Also make sure you are drinking plenty of fluids. You may take the hydrocodone prescribed for pain relief.  This will make you drowsy - do not drive within 4 hours of taking this medication.  Plan to see your doctor within the next week for recheck of your symptoms and for further management if your symptoms persist or worsen.

## 2017-09-21 NOTE — ED Notes (Signed)
E Sig pad not working, but patient verbalized understanding.

## 2017-09-21 NOTE — ED Triage Notes (Signed)
Pt is stating he is having upper right sided quadrant pain that is uncomfortable. States he cannot get comfortable. Was treated at an urgent care and stated that he may possibly have a hernia. Pt states pain hurts worse when he is bending over and when he squats down.

## 2017-09-21 NOTE — Progress Notes (Signed)
Inpatient Diabetes Program Recommendations  AACE/ADA: New Consensus Statement on Inpatient Glycemic Control (2015)  Target Ranges:  Prepandial:   less than 140 mg/dL      Peak postprandial:   less than 180 mg/dL (1-2 hours)      Critically ill patients:  140 - 180 mg/dL   Lab Results  Component Value Date   GLUCAP 326 (H) 08/30/2014   Noted elevated BS.  Recommend CBGs q 4 hours with correction insulin.  Mellissa KohutSherrie Hamish Banks RD, CDE. M.Ed. Pager [phone] Inpatient Diabetes Coordinator

## 2017-09-21 NOTE — ED Notes (Signed)
Pt return from CT.

## 2017-09-21 NOTE — ED Provider Notes (Signed)
[facility] EMERGENCY DEPARTMENT Provider Note   CSN: [id number] Arrival date & time: 09/21/17  0740     History   Chief Complaint Chief Complaint  Patient presents with  . Abdominal Pain    HPI Raymond Sanchez is a 42 y.o. male with a history of well-controlled diabetes presenting with a 2 to 3-week history of right upper quadrant abdominal pain.  He first noticed it later in the day after heavy lifting involving moving a trailer off of the trailer hitch, but denied pain at the time of this lifting event.  Since then he has had gradually worsening symptoms described as a deep burning pain localized to the right upper quadrant.  Pain is now constant, but is worsened with positional changes, especially with bending from the waist.  He denies nausea, vomiting, diarrhea or constipation and has had no fevers.  Surgical history is significant for cholecystectomy and appendectomy.  He denies any swelling or masses at the site.  He maintains a good appetite.  He has found no alleviators for his symptoms.  His last CBG check 4 days ago was 110.  He was seen at our urgent care center 4 days ago, no tests were run, but there was concern over a possible hernia although no palpable mass was felt that the site.  He does have a lower abdominal diastasis recti.  The history is provided by the patient.    Past Medical History:  Diagnosis Date  . Diabetes mellitus without complication (HCC)   . Hypertension     There are no active problems to display for this patient.   Past Surgical History:  Procedure Laterality Date  . APPENDECTOMY    . ARTHROSCOPIC REPAIR ACL Right 2001  . CHOLECYSTECTOMY    . FOOT SURGERY    . KNEE ARTHROSCOPY          Home Medications    Prior to Admission medications   Medication Sig Start Date End Date Taking? Authorizing Provider  fexofenadine (ALLEGRA) 180 MG tablet Take 180 mg by mouth daily.   Yes [provider]  insulin lispro (HUMALOG) 100  UNIT/ML injection Inject into the skin as needed for high blood sugar (per silding scale).    Yes [provider]  linagliptin (TRADJENTA) 5 MG TABS tablet Take 5 mg by mouth daily.   Yes [provider]  PROAIR HFA 108 (90 BASE) MCG/ACT inhaler Inhale 1 puff into the lungs every 4 (four) hours as needed for wheezing or shortness of breath.  08/10/14  Yes [provider]  HYDROcodone-acetaminophen (NORCO/VICODIN) 5-325 MG tablet Take 1 tablet by mouth every 4 (four) hours as needed. 09/21/17   Kysha Muralles, Raynelle Fanning, PA-C  naproxen (NAPROSYN) 375 MG tablet Take 1 tablet (375 mg total) by mouth 2 (two) times daily as needed for moderate pain. 09/17/17   Rennis Harding, PA-C    Family History No family history on file.  Social History Social History   Tobacco Use  . Smoking status: Light Tobacco Smoker  . Smokeless tobacco: Current User    Types: Chew  Substance Use Topics  . Alcohol use: Yes    Comment: occ.   . Drug use: No     Allergies   Patient has no known allergies.   Review of Systems Review of Systems  Constitutional: Negative for chills.  HENT: Negative for congestion and sore throat.   Eyes: Negative.   Respiratory: Negative for chest tightness and shortness of breath.   Cardiovascular:  Negative for chest pain.  Genitourinary: Negative.   Musculoskeletal: Negative for joint swelling and neck pain.  Skin: Negative.  Negative for rash and wound.  Neurological: Negative for dizziness, weakness, light-headedness and numbness.  Psychiatric/Behavioral: Negative.      Physical Exam Updated Vital Signs BP 121/81 (BP Location: Right Arm)   Pulse 65   Temp 98 F (36.7 C) (Oral)   Resp 12   Ht 6\' 5"  (1.956 m)   Wt 108.9 kg (240 lb)   SpO2 100%   BMI 28.46 kg/m   Physical Exam  Constitutional: He appears well-developed and well-nourished.  HENT:  Head: Normocephalic and atraumatic.  Eyes: Conjunctivae are normal.  Neck: Normal range of motion.    Cardiovascular: Normal rate, regular rhythm, normal heart sounds and intact distal pulses.  Pulmonary/Chest: Effort normal and breath sounds normal. He has no wheezes.  Abdominal: Soft. Bowel sounds are normal. He exhibits distension. He exhibits no fluid wave. There is no tenderness. There is no rigidity, no guarding, no CVA tenderness and negative Murphy's sign.  Mild increased abdominal distention with increased tympany throughout.  Localized right upper quadrant pain without appreciable mass.  Musculoskeletal: Normal range of motion.  Neurological: He is alert.  Skin: Skin is warm and dry.  Psychiatric: He has a normal mood and affect.  Nursing note and vitals reviewed.    ED Treatments / Results  Labs (all labs ordered are listed, but only abnormal results are displayed) Labs Reviewed  COMPREHENSIVE METABOLIC PANEL - Abnormal; Notable for the following components:      Result Value   Chloride 98 (*)    Glucose, Bld 337 (*)    All other components within normal limits  URINALYSIS, ROUTINE W REFLEX MICROSCOPIC - Abnormal; Notable for the following components:   Specific Gravity, Urine >1.046 (*)    Glucose, UA >=500 (*)    Ketones, ur 80 (*)    All other components within normal limits  CBC WITH DIFFERENTIAL/PLATELET  LIPASE, BLOOD    EKG None  Radiology Ct Abdomen Pelvis W Contrast  Result Date: 09/21/2017 CLINICAL DATA:  Three-week history of upper abdominal pain EXAM: CT ABDOMEN AND PELVIS WITH CONTRAST TECHNIQUE: Multidetector CT imaging of the abdomen and pelvis was performed using the standard protocol following bolus administration of intravenous contrast. CONTRAST:  100mL ISOVUE-300 IOPAMIDOL (ISOVUE-300) INJECTION 61% COMPARISON:  Aug 30, 2014 FINDINGS: Lower chest: There is a stable 3 mm nodular opacity abutting the pleura in the lateral segment of the right lower lobe seen on axial slice 19 series 3. Lung bases otherwise are clear. Hepatobiliary: No focal liver  lesions are evident. Gallbladder is absent. There is no biliary duct dilatation. Pancreas: No pancreatic mass or inflammatory focus is evident. Slight fatty infiltration is noted in the head of the pancreas. Spleen: No focal liver lesions are evident. Adrenals/Urinary Tract: Adrenals bilaterally appear unremarkable. Kidneys bilaterally show no evident mass or hydronephrosis on either side. There is no appreciable renal or ureteral calculus on either side. Urinary bladder is midline with wall thickness within normal limits. Stomach/Bowel: There is no appreciable bowel wall or mesenteric thickening. There is no appreciable bowel obstruction. No free air or portal venous air. Vascular/Lymphatic: There is no abdominal aortic aneurysm. No appreciable vascular lesions evident beyond minimal calcification in the right common iliac artery. No adenopathy is appreciable in the abdomen or pelvis. Reproductive: Prostate and seminal vesicles appear normal in size and configuration. There is a small calculus in the prostate. No pelvic  mass evident. Other: Appendix absent. No abscess or ascites is evident in the abdomen or pelvis. Musculoskeletal: There are no appreciable blastic or lytic bone lesions. There is no intramuscular or abdominal wall lesion evident. IMPRESSION: 1. No appreciable bowel wall thickening or bowel obstruction. No abscess. Appendix absent. 2. No renal or ureteral calculus. No hydronephrosis. There is a small prostatic calculus evident. 3.  Gallbladder absent.  No biliary duct dilatation. 4. Stable 3 mm nodular opacity right lower lobe. Stability since 2016 is indicative of benign etiology. Electronically Signed   By: Bretta Bang III M.D.   On: 09/21/2017 09:56    Procedures Procedures (including critical care time)  Medications Ordered in ED Medications  iopamidol (ISOVUE-300) 61 % injection 100 mL (100 mLs Intravenous Contrast Given 09/21/17 0927)     Initial Impression / Assessment and  Plan / ED Course  I have reviewed the triage vital signs and the nursing notes.  Pertinent labs & imaging results that were available during my care of the patient were reviewed by me and considered in my medical decision making (see chart for details).     Pt with RUQ pain without clear etiology, but no emergent findings on CT imaging. Labs normal except for hyperglycemia without gap.  He has ketonuria but spec gravity normal. Reports drinks plenty of fluids, no n/v.  Possible patient does have a small abdominal wall hernia or scar tissue tearing with recent lifting event, but no obstruction seen.  Advised f/u with pcp which pt agrees for recheck.  Advised close watch on cbg's and to utilize his SSI.  Prn f/u anticipated.  The patient appears reasonably screened and/or stabilized for discharge and I doubt any other medical condition or other [facility] requiring further screening, evaluation, or treatment in the ED at this time prior to discharge.   Final Clinical Impressions(s) / ED Diagnoses   Final diagnoses:  Right upper quadrant abdominal pain  Hyperglycemia    ED Discharge Orders        Ordered    HYDROcodone-acetaminophen (NORCO/VICODIN) 5-325 MG tablet  Every 4 hours PRN     09/21/17 1112       Burgess Amor, PA-C 09/21/17 1759    Terrilee Files, MD 09/22/17 [id number]

## 2017-10-27 DIAGNOSIS — Z1389 Encounter for screening for other disorder: Secondary | ICD-10-CM | POA: Diagnosis not present

## 2017-10-27 DIAGNOSIS — Z1331 Encounter for screening for depression: Secondary | ICD-10-CM | POA: Diagnosis not present

## 2017-10-27 DIAGNOSIS — K219 Gastro-esophageal reflux disease without esophagitis: Secondary | ICD-10-CM | POA: Diagnosis not present

## 2017-10-27 DIAGNOSIS — E119 Type 2 diabetes mellitus without complications: Secondary | ICD-10-CM | POA: Diagnosis not present

## 2017-10-27 DIAGNOSIS — Z6829 Body mass index (BMI) 29.0-29.9, adult: Secondary | ICD-10-CM | POA: Diagnosis not present

## 2017-11-29 DIAGNOSIS — Z6830 Body mass index (BMI) 30.0-30.9, adult: Secondary | ICD-10-CM | POA: Diagnosis not present

## 2017-11-29 DIAGNOSIS — E1165 Type 2 diabetes mellitus with hyperglycemia: Secondary | ICD-10-CM | POA: Diagnosis not present

## 2019-07-27 ENCOUNTER — Ambulatory Visit: Payer: 59 | Admitting: Allergy & Immunology

## 2019-08-03 ENCOUNTER — Encounter: Payer: Self-pay | Admitting: Allergy & Immunology

## 2019-08-03 ENCOUNTER — Other Ambulatory Visit: Payer: Self-pay

## 2019-08-03 ENCOUNTER — Ambulatory Visit: Payer: 59 | Admitting: Allergy & Immunology

## 2019-08-03 VITALS — BP 138/84 | HR 81 | Temp 98.1°F | Resp 18 | Ht 77.2 in | Wt 257.8 lb

## 2019-08-03 DIAGNOSIS — J4520 Mild intermittent asthma, uncomplicated: Secondary | ICD-10-CM

## 2019-08-03 DIAGNOSIS — T63481D Toxic effect of venom of other arthropod, accidental (unintentional), subsequent encounter: Secondary | ICD-10-CM | POA: Diagnosis not present

## 2019-08-03 DIAGNOSIS — L2089 Other atopic dermatitis: Secondary | ICD-10-CM

## 2019-08-03 DIAGNOSIS — J3089 Other allergic rhinitis: Secondary | ICD-10-CM | POA: Diagnosis not present

## 2019-08-03 DIAGNOSIS — J302 Other seasonal allergic rhinitis: Secondary | ICD-10-CM

## 2019-08-03 MED ORDER — LEVOCETIRIZINE DIHYDROCHLORIDE 5 MG PO TABS: 5.0000 mg | ORAL_TABLET | Freq: Every evening | ORAL | 5 refills | Status: AC

## 2019-08-03 MED ORDER — EPINEPHRINE 0.3 MG/0.3ML IJ SOAJ: 0.3000 mg | INTRAMUSCULAR | 1 refills | Status: DC | PRN

## 2019-08-03 MED ORDER — XHANCE 93 MCG/ACT NA EXHU: 1.0000 | INHALANT_SUSPENSION | Freq: Two times a day (BID) | NASAL | 5 refills | Status: DC

## 2019-08-03 MED ORDER — TRIAMCINOLONE 0.1 % CREAM:EUCERIN CREAM 1:1: 1.0000 "application " | TOPICAL_CREAM | Freq: Two times a day (BID) | CUTANEOUS | 2 refills | Status: DC

## 2019-08-03 MED ORDER — MONTELUKAST SODIUM 10 MG PO TABS: 10.0000 mg | ORAL_TABLET | Freq: Every day | ORAL | 5 refills | Status: AC

## 2019-08-03 MED ORDER — AZELASTINE HCL 0.1 % NA SOLN: NASAL | 5 refills | Status: DC

## 2019-08-03 MED ORDER — TRIAMCINOLONE ACETONIDE 0.1 % EX OINT
1.0000 | TOPICAL_OINTMENT | Freq: Two times a day (BID) | CUTANEOUS | 2 refills | Status: DC
Start: 2019-08-03 — End: 2021-04-17

## 2019-08-03 MED ORDER — QVAR REDIHALER 80 MCG/ACT IN AERB: 2.0000 | INHALATION_SPRAY | Freq: Two times a day (BID) | RESPIRATORY_TRACT | 3 refills | Status: DC

## 2019-08-03 NOTE — Patient Instructions (Signed)
1. Mild intermittent asthma, uncomplicated - Lung testing looked great today. - I think a lot of this is allergy mediated and once we get your allergies under better control, your breathing control will follow. - However, in the meantime, I think it would be good to start a daily inhaled steroid. - This should help control inflammation within the lungs.  - Daily controller medication(s): Qvar Redihaler 2 puffs twice daily - Prior to physical activity: albuterol 2 puffs 10-15 minutes before physical activity. - Rescue medications: albuterol 4 puffs every 4-6 hours as needed - Asthma control goals:  * Full participation in all desired activities (may need albuterol before activity) * Albuterol use two time or less a week on average (not counting use with activity) * Cough interfering with sleep two time or less a month * Oral steroids no more than once a year * No hospitalizations  2. Seasonal and perennial allergic rhinitis - Testing today showed: grasses, ragweed, weeds, indoor molds, outdoor molds, dust mites, cat and dog - Copy of test results provided.  - Avoidance measures provided. - Stop taking: Allegra and Flonase - Start taking: Xyzal (levocetirizine) 5mg  tablet once daily, Singulair (montelukast) 10mg  daily, Xhance (fluticasone) 1-2 sprays per nostril daily and Astelin (azelastine) 2 sprays per nostril 1-2 times daily as needed  - Singulair can cause irritability as well as depression and nightmares, so be aware of this. - The vast majority tolerated just fine (and both of my kids take it).  - You can use an extra dose of the antihistamine, if needed, for breakthrough symptoms.  - Consider nasal saline rinses 1-2 times daily to remove allergens from the nasal cavities as well as help with mucous clearance (this is especially helpful to do before the nasal sprays are given) - Strongly consider allergy shots as a means of long-term control. - Allergy shots "re-train" and  "reset" the immune system to ignore environmental allergens and decrease the resulting immune response to those allergens (sneezing, itchy watery eyes, runny nose, nasal congestion, etc).    - Allergy shots improve symptoms in 75-85% of patients.  - We can discuss more at the next appointment if the medications are not working for you.  3. Flexural atopic dermatitis - As with the asthma, I think that once we get your allergies under control, your skin will improve as well. - Be sure to moisturize twice daily. - Add on triamcinolone compounded with Eucerin twice daily to all of the spots (do this EVERY DAY for one week and then as needed to keep things under control).  - Consider adding on Dupixent for eczema treatment (handout provided).   4. Yellow jacket anaphylaxis - We are going to get some blood work to confirm this. - We we will call you in 1 to 2 weeks for the results of the testing. - I am going to want you to have an EpiPen on hand given your symptoms. - Anaphylaxis management plan provided.  5. Return in about 6 weeks (around 09/14/2019). This can be an in-person, a virtual Webex or a telephone follow up visit.   Please inform 06-30-1993 of any Emergency Department visits, hospitalizations, or changes in symptoms. Call 11/14/2019 before going to the ED for breathing or allergy symptoms since we might be able to fit you in for a sick visit. Feel free to contact us anytime with any questions, problems, or concerns.  It was a pleasure to meet you today!  Websites that have reliable patient  information: 1. American Academy of Asthma, Allergy, and Immunology: www.aaaai.org 2. Food Allergy Research and Education (FARE): foodallergy.org 3. Mothers of Asthmatics: http://www.asthmacommunitynetwork.org 4. American College of Allergy, Asthma, and Immunology: www.acaai.org   COVID-19 Vaccine Information can be found at: [email or web address] For  questions related to vaccine distribution or appointments, please email [email or web address] or call [phone].     "Like" us on Facebook and Instagram for our latest updates!       HAPPY SPRING!  Make sure you are registered to vote! If you have moved or changed any of your contact information, you will need to get this updated before voting!  In some cases, you MAY be able to register to vote online: [email or web address]    Reducing Pollen Exposure  The American Academy of Allergy, Asthma and Immunology suggests the following steps to reduce your exposure to pollen during allergy seasons.    1. Do not hang sheets or clothing out to dry; pollen may collect on these items. 2. Do not mow lawns or spend time around freshly cut grass; mowing stirs up pollen. 3. Keep windows closed at night.  Keep car windows closed while driving. 4. Minimize morning activities outdoors, a time when pollen counts are usually at their highest. 5. Stay indoors as much as possible when pollen counts or humidity is high and on windy days when pollen tends to remain in the air longer. 6. Use air conditioning when possible.  Many air conditioners have filters that trap the pollen spores. 7. Use a HEPA room air filter to remove pollen form the indoor air you breathe.  Control of Mold Allergen   Mold and fungi can grow on a variety of surfaces provided certain temperature and moisture conditions exist.  Outdoor molds grow on plants, decaying vegetation and soil.  The major outdoor mold, Alternaria and Cladosporium, are found in very high numbers during hot and dry conditions.  Generally, a late Summer - Fall peak is seen for common outdoor fungal spores.  Rain will temporarily lower outdoor mold spore count, but counts rise rapidly when the rainy period ends.  The most important indoor molds are Aspergillus and Penicillium.  Dark, humid and poorly ventilated basements are ideal  sites for mold growth.  The next most common sites of mold growth are the bathroom and the kitchen.  Outdoor (Seasonal) Mold Control  Positive outdoor molds via skin testing: Alternaria, Cladosporium, Bipolaris (Helminthsporium), Drechslera (Curvalaria), Mucor and Epicoccum  1. Use air conditioning and keep windows closed 2. Avoid exposure to decaying vegetation. 3. Avoid leaf raking. 4. Avoid grain handling. 5. Consider wearing a face mask if working in moldy areas.  6.   Indoor (Perennial) Mold Control   Positive indoor molds via skin testing: Aspergillus, Penicillium, Fusarium, Aureobasidium (Pullulara) and Rhizopus  1. Maintain humidity below 50%. 2. Clean washable surfaces with 5% bleach solution. 3. Remove sources e.g. contaminated carpets.     Control of Dog or Cat Allergen  Avoidance is the best way to manage a dog or cat allergy. If you have a dog or cat and are allergic to dog or cats, consider removing the dog or cat from the home. If you have a dog or cat but don't want to find it a new home, or if your family wants a pet even though someone in the household is allergic, here are some strategies that may help keep symptoms at bay:  1. Keep the pet out of your bedroom and restrict it to only a few rooms.  Be advised that keeping the dog or cat in only one room will not limit the allergens to that room. 2. Don't pet, hug or kiss the dog or cat; if you do, wash your hands with soap and water. 3. High-efficiency particulate air (HEPA) cleaners run continuously in a bedroom or living room can reduce allergen levels over time. 4. Regular use of a high-efficiency vacuum cleaner or a central vacuum can reduce allergen levels. 5. Giving your dog or cat a bath at least once a week can reduce airborne allergen.  Control of Dust Mite Allergen    Dust mites play a major role in allergic asthma and rhinitis.  They occur in environments with high humidity wherever human skin is  found.  Dust mites absorb humidity from the atmosphere (ie, they do not drink) and feed on organic matter (including shed human and animal skin).  Dust mites are a microscopic type of insect that you cannot see with the naked eye.  High levels of dust mites have been detected from mattresses, pillows, carpets, upholstered furniture, bed covers, clothes, soft toys and any woven material.  The principal allergen of the dust mite is found in its feces.  A gram of dust may contain 1,000 mites and 250,000 fecal particles.  Mite antigen is easily measured in the air during house cleaning activities.  Dust mites do not bite and do not cause harm to humans, other than by triggering allergies/asthma.    Ways to decrease your exposure to dust mites in your home:  1. Encase mattresses, box springs and pillows with a mite-impermeable barrier or cover   2. Wash sheets, blankets and drapes weekly in hot water (130 F) with detergent and dry them in a dryer on the hot setting.  3. Have the room cleaned frequently with a vacuum cleaner and a damp dust-mop.  For carpeting or rugs, vacuuming with a vacuum cleaner equipped with a high-efficiency particulate air (HEPA) filter.  The dust mite allergic individual should not be in a room which is being cleaned and should wait 1 hour after cleaning before going into the room. 4. Do not sleep on upholstered furniture (eg, couches).   5. If possible removing carpeting, upholstered furniture and drapery from the home is ideal.  Horizontal blinds should be eliminated in the rooms where the person spends the most time (bedroom, study, television room).  Washable vinyl, roller-type shades are optimal. 6. Remove all non-washable stuffed toys from the bedroom.  Wash stuffed toys weekly like sheets and blankets above.   7. Reduce indoor humidity to less than 50%.  Inexpensive humidity monitors can be purchased at most hardware stores.  Do not use a humidifier as can make the problem worse  and are not recommended.  Allergy Shots   Allergies are the result of a chain reaction that starts in the immune system. Your immune system controls how your body defends itself. For instance, if you have an allergy to pollen, your immune system identifies pollen as an invader or allergen. Your immune system overreacts by producing antibodies called Immunoglobulin E (IgE). These antibodies travel to cells that release chemicals, causing an allergic reaction.  The concept behind allergy immunotherapy, whether it is received in the form of shots or tablets, is that the immune system can be desensitized to specific allergens that trigger allergy symptoms. Although it requires time and patience, the payback can be long-term relief.  How Do Allergy Shots Work?  Allergy shots work much like a  vaccine. Your body responds to injected amounts of a particular allergen given in increasing doses, eventually developing a resistance and tolerance to it. Allergy shots can lead to decreased, minimal or no allergy symptoms.  There generally are two phases: build-up and maintenance. Build-up often ranges from three to six months and involves receiving injections with increasing amounts of the allergens. The shots are typically given once or twice a week, though more rapid build-up schedules are sometimes used.  The maintenance phase begins when the most effective dose is reached. This dose is different for each person, depending on how allergic you are and your response to the build-up injections. Once the maintenance dose is reached, there are longer periods between injections, typically two to four weeks.  Occasionally doctors give cortisone-type shots that can temporarily reduce allergy symptoms. These types of shots are different and should not be confused with allergy immunotherapy shots.  Who Can Be Treated with Allergy Shots?  Allergy shots may be a good treatment approach for people with allergic rhinitis  (hay fever), allergic asthma, conjunctivitis (eye allergy) or stinging insect allergy.   Before deciding to begin allergy shots, you should consider:  . The length of allergy season and the severity of your symptoms . Whether medications and/or changes to your environment can control your symptoms . Your desire to avoid long-term medication use . Time: allergy immunotherapy requires a major time commitment . Cost: may vary depending on your insurance coverage  Allergy shots for children age 72 and older are effective and often well tolerated. They might prevent the onset of new allergen sensitivities or the progression to asthma.  Allergy shots are not started on patients who are pregnant but can be continued on patients who become pregnant while receiving them. In some patients with other medical conditions or who take certain common medications, allergy shots may be of risk. It is important to mention other medications you talk to your allergist.   When Will I Feel Better?  Some may experience decreased allergy symptoms during the build-up phase. For others, it may take as long as 12 months on the maintenance dose. If there is no improvement after a year of maintenance, your allergist will discuss other treatment options with you.  If you aren't responding to allergy shots, it may be because there is not enough dose of the allergen in your vaccine or there are missing allergens that were not identified during your allergy testing. Other reasons could be that there are high levels of the allergen in your environment or major exposure to non-allergic triggers like tobacco smoke.  What Is the Length of Treatment?  Once the maintenance dose is reached, allergy shots are generally continued for three to five years. The decision to stop should be discussed with your allergist at that time. Some people may experience a permanent reduction of allergy symptoms. Others may relapse and a longer course  of allergy shots can be considered.  What Are the Possible Reactions?  The two types of adverse reactions that can occur with allergy shots are local and systemic. Common local reactions include very mild redness and swelling at the injection site, which can happen immediately or several hours after. A systemic reaction, which is less common, affects the entire body or a particular body system. They are usually mild and typically respond quickly to medications. Signs include increased allergy symptoms such as sneezing, a stuffy nose or hives.  Rarely, a serious systemic reaction called anaphylaxis can develop. Symptoms  include swelling in the throat, wheezing, a feeling of tightness in the chest, nausea or dizziness. Most serious systemic reactions develop within 30 minutes of allergy shots. This is why it is strongly recommended you wait in your doctor's office for 30 minutes after your injections. Your allergist is trained to watch for reactions, and his or her staff is trained and equipped with the proper medications to identify and treat them.  Who Should Administer Allergy Shots?  The preferred location for receiving shots is your prescribing allergist's office. Injections can sometimes be given at another facility where the physician and staff are trained to recognize and treat reactions, and have received instructions by your prescribing allergist.

## 2019-08-03 NOTE — Progress Notes (Signed)
NEW PATIENT  Date of Service/Encounter:  08/03/19  Referring provider: Lovey Newcomer, PA   Assessment:   Mild intermittent asthma, uncomplicated  Seasonal and perennial allergic rhinitis (grasses, ragweed, weeds, indoor molds, outdoor molds, dust mites, cat and dog)  Flexural atopic dermatitis  Insect sting allergy  Plan/Recommendations:   1. Mild intermittent asthma, uncomplicated - Lung testing looked great today. - I think a lot of this is allergy mediated and once we get your allergies under better control, your breathing control will follow. - However, in the meantime, I think it would be good to start a daily inhaled steroid. - This should help control inflammation within the lungs.  - Daily controller medication(s): Qvar Redihaler 2 puffs twice daily - Prior to physical activity: albuterol 2 puffs 10-15 minutes before physical activity. - Rescue medications: albuterol 4 puffs every 4-6 hours as needed - Asthma control goals:  * Full participation in all desired activities (may need albuterol before activity) * Albuterol use two time or less a week on average (not counting use with activity) * Cough interfering with sleep two time or less a month * Oral steroids no more than once a year * No hospitalizations  2. Seasonal and perennial allergic rhinitis - Testing today showed: grasses, ragweed, weeds, indoor molds, outdoor molds, dust mites, cat and dog - Copy of test results provided.  - Avoidance measures provided. - Stop taking: Allegra and Flonase - Start taking: Xyzal (levocetirizine) 5mg  tablet once daily, Singulair (montelukast) 10mg  daily, Xhance (fluticasone) 1-2 sprays per nostril daily and Astelin (azelastine) 2 sprays per nostril 1-2 times daily as needed  - Singulair can cause irritability as well as depression and nightmares, so be aware of this. - The vast majority tolerated just fine (and both of my kids take it).  - You can use an extra dose  of the antihistamine, if needed, for breakthrough symptoms.  - Consider nasal saline rinses 1-2 times daily to remove allergens from the nasal cavities as well as help with mucous clearance (this is especially helpful to do before the nasal sprays are given) - Strongly consider allergy shots as a means of long-term control. - Allergy shots "re-train" and "reset" the immune system to ignore environmental allergens and decrease the resulting immune response to those allergens (sneezing, itchy watery eyes, runny nose, nasal congestion, etc).    - Allergy shots improve symptoms in 75-85% of patients.  - We can discuss more at the next appointment if the medications are not working for you.  3. Flexural atopic dermatitis - As with the asthma, I think that once we get your allergies under control, your skin will improve as well. - Be sure to moisturize twice daily. - Add on triamcinolone compounded with Eucerin twice daily to all of the spots (do this EVERY DAY for one week and then as needed to keep things under control).  - Consider adding on Dupixent for eczema treatment (handout provided).   4. Yellow jacket anaphylaxis - We are going to get some blood work to confirm this. - We we will call you in 1 to 2 weeks for the results of the testing. - I am going to want you to have an EpiPen on hand given your symptoms. - Anaphylaxis management plan provided.  5. Return in about 6 weeks (around 09/14/2019). This can be an in-person, a virtual Webex or a telephone follow up visit.  Subjective:   Raymond Sanchez is a 44 y.o. male  presenting today for evaluation of  Chief Complaint  Patient presents with  . Allergies    Itchy eyes, scratchy throat that has gotten worse over the years  . Rash    Red itchy area on right leg    Raymond Sanchez has a history of the following: Patient Active Problem List   Diagnosis Date Noted  . Mild intermittent asthma, uncomplicated 29/92/4268  . Seasonal and  perennial allergic rhinitis 08/05/2019  . Flexural atopic dermatitis 08/05/2019  . Insect sting allergy 08/05/2019    History obtained from: chart review and patient.  Raymond Sanchez was referred by Raymond Lemons, PA.     [name] is a 44 y.o. male presenting for an evaluation of multiple atopic complaints.   Asthma/Respiratory Symptom History: He does have a script for an inhaler. He does not use it routinely. He estimates that he goes through one inhaler every 6-8 months. He thinks that this is all allergy triggered. AT night he does the throat clearing. But he has woken himself up scratching with his tongue. He has never used a controller medication at this time.   Allergic Rhinitis Symptom History: He reports that he has been dealing with seasonal rhinitis for a "long time" but it is getting worse. He did have a flare up first with a cat when he was 39-34 years of age. He ended up the hospital with wheezing and whatnot. He had a cat get in his lap and then within 30 minutes he had swelling and breathing difficulties. Despite this, he has two cats at home. They are outdoors when he is at home, but he knows that they go inside when he is not home. There is a dog at home too, but he has never had any issues with a short haired dog. He does report throat clearing every night throughout the entire year. This has been rough without the medications. He has been on Allegra for a long time. He has tried all of the others wit varying degrees of success. His Allegra lasts only for half of the day. He will double the dose occasionally. He does use Flonase occasionally. This seems to help some, but he does not get a lot of relief. He has never been tested.   Eczema Symptom History: He does has eczema over much of his body. Cold temperatures tend to make it worse. He typically treats with Gold Bond medicated ointment. He does not have a prescription tol use on it currently, but he has been on one in the past.  He estimates that he has been on around 2-3 prescription steroid emollients with complete improvement. He does not like to take medications on a regular basis.   Stinging Insect Symptom History: He reports that he has throat tightness consistently with yellow stinging insects. He does not have an up-to-date epinephrine injector. He has never been tested for stinging insects.  He grew up in [place] but lives in [place]. 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  Otherwise, there is no history of other atopic diseases, including food allergies, drug allergies or contact dermatitis. There is no significant infectious history. Vaccinations are up to date.    Past Medical History: Patient Active Problem List   Diagnosis Date Noted  . Mild intermittent asthma, uncomplicated 34/19/6222  . Seasonal and perennial allergic rhinitis 08/05/2019  . Flexural atopic dermatitis 08/05/2019  . Insect  sting allergy 08/05/2019    Medication List:  Allergies as of 08/03/2019   No Known Allergies     Medication List       Accurate as of August 03, 2019 11:59 PM. If you have any questions, ask your nurse or doctor.        STOP taking these medications   HYDROcodone-acetaminophen 5-325 MG tablet Commonly known as: NORCO/VICODIN Stopped by: Alfonse Spruce, MD   insulin lispro 100 UNIT/ML injection Commonly known as: HUMALOG Stopped by: Alfonse Spruce, MD   linagliptin 5 MG Tabs tablet Commonly known as: TRADJENTA Stopped by: Alfonse Spruce, MD   naproxen 375 MG tablet Commonly known as: NAPROSYN Stopped by: Alfonse Spruce, MD   ProAir HFA 108 [phone] Base) MCG/ACT inhaler Generic drug: albuterol Stopped by: Alfonse Spruce, MD     TAKE these medications   azelastine 0.1 % nasal spray Commonly known as: ASTELIN Use 2 sprays 1-2 times daily as needed. Started by: Alfonse Spruce, MD   EPINEPHrine 0.3 mg/0.3 mL Soaj injection Commonly known as: EpiPen 2-Pak Inject 0.3 mLs (0.3 mg total) into the muscle as needed for anaphylaxis. Started by: Alfonse Spruce, MD   fexofenadine 180 MG tablet Commonly known as: ALLEGRA Take 180 mg by mouth daily.   Jardiance 25 MG Tabs tablet Generic drug: empagliflozin Take 25 mg by mouth daily.   levocetirizine 5 MG tablet Commonly known as: XYZAL Take 1 tablet (5 mg total) by mouth every evening. Started by: Alfonse Spruce, MD   montelukast 10 MG tablet Commonly known as: Singulair Take 1 tablet (10 mg total) by mouth at bedtime. Started by: Alfonse Spruce, MD   Qvar RediHaler 80 MCG/ACT inhaler Generic drug: beclomethasone Inhale 2 puffs into the lungs 2 (two) times daily. Started by: Alfonse Spruce, MD   triamcinolone 0.1 % cream : eucerin Crea Apply 1 application topically 2 (two) times daily. Started by: Alfonse Spruce, MD   triamcinolone ointment 0.1 % Commonly known as: KENALOG Apply 1 application topically 2 (two) times daily. Started by: Alfonse Spruce, MD   Charmayne Sheer MCG/ACT Exhu Generic drug: Fluticasone Propionate Place 1-2 sprays into the nose 2 (two) times daily. Started by: Alfonse Spruce, MD       Birth History: non-contributory  Developmental History: non-contributory  Past Surgical History: Past Surgical History:  Procedure Laterality Date  . APPENDECTOMY    . ARTHROSCOPIC REPAIR ACL Right 2001  . CHOLECYSTECTOMY    . FOOT SURGERY    . KNEE ARTHROSCOPY       Family History: Family History  Problem Relation Age of Onset  . Allergic rhinitis Father   . Angioedema Neg Hx   . Asthma Neg Hx   . Atopy Neg Hx   . Eczema Neg Hx   . Immunodeficiency Neg Hx   . Urticaria Neg Hx      Social History: Cruze lives at home with his wife and 3 children.  They live in a house that is almost 43 years old.  There is hardwood throughout  the home.  They have a heat pump for heating and central cooling.  There is a dog and a cat in the home.  They do have chickens.  They do have dust mite covers on the bed, but not the pillows.  There is tobacco exposure.  He currently works as a Soil scientist.  He previously worked for [employer].  Review of Systems  Constitutional: Negative.  Negative for chills, fever, malaise/fatigue and weight loss.  HENT: Positive for congestion. Negative for ear discharge, ear pain and sore throat.        Positive for postnasal drip.  Positive for throat clearing.  Eyes: Negative for pain, discharge and redness.  Respiratory: Negative for cough, sputum production, shortness of breath and wheezing.   Cardiovascular: Negative.  Negative for chest pain and palpitations.  Gastrointestinal: Negative for abdominal pain, constipation, diarrhea, heartburn, nausea and vomiting.  Skin: Negative.  Negative for itching and rash.  Neurological: Negative for dizziness and headaches.  Endo/Heme/Allergies: Negative for environmental allergies. Does not bruise/bleed easily.       Objective:   Blood pressure 138/84, pulse 81, temperature 98.1 F (36.7 C), temperature source Temporal, resp. rate 18, height 6' 5.2" (1.961 m), weight 257 lb 12.8 oz (116.9 kg), SpO2 97 %. Body mass index is 30.41 kg/m.   Physical Exam:   Physical Exam  Constitutional: He appears well-developed and well-nourished.  Very pleasant male.  Quite courteous.  HENT:  Head: Normocephalic and atraumatic.  Right Ear: Tympanic membrane, external ear and ear canal normal. No drainage, swelling or tenderness. Tympanic membrane is not injected, not scarred, not erythematous, not retracted and not bulging.  Left Ear: Tympanic membrane, external ear and ear canal normal. No drainage, swelling or tenderness. Tympanic membrane is not injected, not scarred, not erythematous, not retracted and not bulging.  Nose: Mucosal edema and  rhinorrhea present. No nasal deformity or septal deviation. No epistaxis. Right sinus exhibits no maxillary sinus tenderness and no frontal sinus tenderness. Left sinus exhibits no maxillary sinus tenderness and no frontal sinus tenderness.  Mouth/Throat: Uvula is midline and oropharynx is clear and moist. Mucous membranes are not pale and not dry.  Turbinates markedly enlarged.  No epistaxis.  No septal deviation from what I can see.  Tonsils normal bilaterally.  He does have marked cobblestoning.  Eyes: Pupils are equal, round, and reactive to light. Conjunctivae and EOM are normal. Right eye exhibits no chemosis and no discharge. Left eye exhibits no chemosis and no discharge. Right conjunctiva is not injected. Left conjunctiva is not injected.  Cardiovascular: Normal rate, regular rhythm and normal heart sounds.  Respiratory: Effort normal and breath sounds normal. No accessory muscle usage. No tachypnea. No respiratory distress. He has no wheezes. He has no rhonchi. He has no rales. He exhibits no tenderness.  Moving air well in all lung fields.  No increased work of breathing.  GI: There is no abdominal tenderness. There is no rebound and no guarding.  Lymphadenopathy:       Head (right side): No submandibular, no tonsillar and no occipital adenopathy present.       Head (left side): No submandibular, no tonsillar and no occipital adenopathy present.    He has no cervical adenopathy.  Neurological: He is alert.  Skin: No abrasion, no petechiae and no rash noted. Rash is not papular, not vesicular and not urticarial. No erythema. No pallor.  Multiple eczematous lesions on his bilateral arms as well as his flanks.  There is no honey crusting, but there are multiple excoriations present.  Psychiatric: He has a normal mood and affect.     Diagnostic studies:    Spirometry: results normal (FEV1: 4.81/94%, FVC: 4.96/76%, FEV1/FVC: 97%).    Spirometry consistent with normal pattern.   Allergy  Studies: none  Airborne Adult Perc - 08/03/19 0849    Time Antigen Placed  4010    Allergen Manufacturer  Greer    Location  Back    Number of Test  59    Panel 1  Select    1. Control-Buffer 50% Glycerol  Negative    2. Control-Histamine 1 mg/ml  2+    3. Albumin saline  Negative    4. Bahia  4+    5. French Southern Territories  2+    6. Johnson  3+    7. [place] Blue  4+    8. Meadow Fescue  2+    9. Perennial Rye  2+    10. Sweet Vernal  3+    11. Timothy  3+    12. Cocklebur  2+    13. Burweed Marshelder  Negative    14. Ragweed, short  Negative    15. Ragweed, Giant  Negative    16. Plantain,  English  Negative    17. Lamb's Quarters  Negative    18. Sheep Sorrell  Negative    19. Rough Pigweed  Negative    20. Marsh Elder, Rough  2+    21. Mugwort, Common  Negative    22. Ash mix  Negative    23. Birch mix  Negative    24. Beech American  Negative    25. Box, Elder  Negative    26. Cedar, red  Negative    27. Cottonwood, Guinea-Bissau  Negative    28. Elm mix  Negative    29. Hickory mix  Negative    30. Maple mix  Negative    31. Oak, Guinea-Bissau mix  Negative    32. Pecan Pollen  Negative    33. Pine mix  Negative    34. Sycamore Eastern  Negative    35. Walnut, Black Pollen  Negative    36. Alternaria alternata  Negative    37. Cladosporium Herbarum  Negative    38. Aspergillus mix  Negative    39. Penicillium mix  Negative    40. Bipolaris sorokiniana (Helminthosporium)  Negative    41. Drechslera spicifera (Curvularia)  Negative    42. Mucor plumbeus  Negative    43. Fusarium moniliforme  Negative    44. Aureobasidium pullulans (pullulara)  Negative    45. Rhizopus oryzae  Negative    46. Botrytis cinera  Negative    47. Epicoccum nigrum  3+    48. Phoma betae  Negative    49. Candida Albicans  Negative    50. Trichophyton mentagrophytes  Negative    51. Mite, D Farinae  5,000 AU/ml  3+    52. Mite, D Pteronyssinus  5,000 AU/ml  3+    53. Cat Hair 10,000 BAU/ml  3+    54.   Dog Epithelia  Negative    55. Mixed Feathers  Negative    56. Horse Epithelia  3+    57. Cockroach, German  Negative    58. Mouse  Negative    59. Tobacco Leaf  Negative     Intradermal - 08/03/19 0935    Allergen Manufacturer  Waynette Buttery    Location  Back    Number of Test  9    Intradermal  Select    Control  Negative    Ragweed mix  4+    Tree mix  Negative    Mold 1  3+    Mold 2  2+    Mold 3  2+    Mold 4  3+  Dog  3+    Cockroach  Negative            Malachi Bonds, MD [facility]

## 2019-08-05 ENCOUNTER — Encounter: Payer: Self-pay | Admitting: Allergy & Immunology

## 2019-08-05 DIAGNOSIS — J302 Other seasonal allergic rhinitis: Secondary | ICD-10-CM | POA: Insufficient documentation

## 2019-08-05 DIAGNOSIS — T63481D Toxic effect of venom of other arthropod, accidental (unintentional), subsequent encounter: Secondary | ICD-10-CM | POA: Insufficient documentation

## 2019-08-05 DIAGNOSIS — L2089 Other atopic dermatitis: Secondary | ICD-10-CM

## 2019-08-05 DIAGNOSIS — J4520 Mild intermittent asthma, uncomplicated: Secondary | ICD-10-CM | POA: Insufficient documentation

## 2019-08-05 HISTORY — DX: Other atopic dermatitis: L20.89

## 2019-08-05 HISTORY — DX: Mild intermittent asthma, uncomplicated: J45.20

## 2019-08-07 ENCOUNTER — Telehealth: Payer: Self-pay

## 2019-08-07 NOTE — Telephone Encounter (Signed)
Prior authorization for Raymond Sanchez has been submitted via covermymeds. I am currently awaiting approval/denial.

## 2019-08-09 NOTE — Telephone Encounter (Signed)
Prior authorization is pending.

## 2019-08-09 NOTE — Telephone Encounter (Signed)
Raymond Sanchez has been approved by insurance. Faxed to pharmacy and sent to scan center.

## 2019-09-14 ENCOUNTER — Ambulatory Visit: Payer: 59 | Admitting: Allergy & Immunology

## 2019-11-02 ENCOUNTER — Ambulatory Visit (HOSPITAL_COMMUNITY): Payer: 59 | Admitting: Occupational Therapy

## 2019-11-09 ENCOUNTER — Ambulatory Visit (HOSPITAL_COMMUNITY): Payer: Managed Care, Other (non HMO) | Attending: Orthopedic Surgery | Admitting: Occupational Therapy

## 2019-11-09 ENCOUNTER — Encounter (HOSPITAL_COMMUNITY): Payer: Self-pay | Admitting: Occupational Therapy

## 2019-11-09 ENCOUNTER — Other Ambulatory Visit: Payer: Self-pay

## 2019-11-09 DIAGNOSIS — R29898 Other symptoms and signs involving the musculoskeletal system: Secondary | ICD-10-CM | POA: Diagnosis present

## 2019-11-09 DIAGNOSIS — M25611 Stiffness of right shoulder, not elsewhere classified: Secondary | ICD-10-CM | POA: Diagnosis present

## 2019-11-09 DIAGNOSIS — M25511 Pain in right shoulder: Secondary | ICD-10-CM | POA: Diagnosis present

## 2019-11-09 NOTE — Therapy (Signed)
[facility] [address] [place], [place], [address] Phone: [phone]   Fax:  [phone]  Occupational Therapy Evaluation  Patient Details  Name: Raymond Sanchez MRN: [id number] Date of Birth: September 24, 1975 Referring Provider (OT): Dr. Francena Hanly    Encounter Date: 11/09/2019   OT End of Session - 11/09/19 1703    Visit Number 1    Number of Visits 8    Date for OT Re-Evaluation 12/09/19    Authorization Type UMR    Authorization Time Period 25 Visit limit    Authorization - Visit Number 1    Authorization - Number of Visits 25    OT Start Time 0857    OT Stop Time 0930    OT Time Calculation (min) 33 min    Activity Tolerance Patient tolerated treatment well    Behavior During Therapy [facility] for tasks assessed/performed           Past Medical History:  Diagnosis Date   Diabetes mellitus without complication (HCC)    Flexural atopic dermatitis 08/05/2019   Hypertension    Mild intermittent asthma, uncomplicated 08/05/2019    Past Surgical History:  Procedure Laterality Date   APPENDECTOMY     ARTHROSCOPIC REPAIR ACL Right 2001   CHOLECYSTECTOMY     FOOT SURGERY     KNEE ARTHROSCOPY      There were no vitals filed for this visit.   Subjective Assessment - 11/09/19 1217    Subjective  S: It started hurting a little over a year ago, out of the blue    Pertinent History Pt is a 44 y/o male with right shoulder adhesive capsulitis present for a little over a year. Pt received a cortisone shot approximately one month with some improvement in symptoms and pain. Pt was referred to occupational therapy for evaluation and treatment by Dr. Francena Hanly.    Special Tests FOTO:    Patient Stated Goals To have range of motion back and use my arm with less pain    Currently in Pain? Yes    Pain Score 4     Pain Location Shoulder    Pain Orientation Right    Pain Descriptors / Indicators Sore;Aching    Pain Type Acute pain     Pain Radiating Towards N/A    Pain Onset More than a month ago    Pain Frequency Intermittent    Aggravating Factors  Movement and use    Pain Relieving Factors Rest    Effect of Pain on Daily Activities Mod affect on ADLs    Multiple Pain Sites No             [facility] OT Assessment - 11/09/19 0859      Assessment   Medical Diagnosis right shoulder adhesive capsulitis    Referring Provider (OT) Dr. Francena Hanly     Onset Date/Surgical Date 10/11/18    Hand Dominance Right    Prior Therapy None      Precautions   Precautions None      Balance Screen   Has the patient fallen in the past 6 months No    Has the patient had a decrease in activity level because of a fear of falling?  No    Is the patient reluctant to leave their home because of a fear of falling?  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 Sudden movement and reaching behind him especially exacerbates pain.       Observation/Other Assessments   Focus on Therapeutic Outcomes (FOTO)  60/100      ROM / Strength   AROM / PROM / Strength AROM;PROM;Strength      Palpation   Palpation comment mod fascial restrictions along upper arm, trapezius, and scapular regions      AROM   Overall AROM Comments Assessed seated    AROM Assessment Site Shoulder    Right/Left Shoulder Right    Right Shoulder Extension 35 Degrees    Right Shoulder Flexion 90 Degrees    Right Shoulder ABduction 95 Degrees      PROM   Overall PROM Comments Assessed seated    PROM Assessment Site Shoulder    Right/Left Shoulder Right    Right Shoulder Extension 40 Degrees    Right Shoulder Flexion 95 Degrees    Right  Shoulder ABduction 100 Degrees      Strength   Overall Strength Comments Assessed seated    Strength Assessment Site Shoulder    Right/Left Shoulder Right    Right Shoulder Flexion 3-/5    Right Shoulder Extension 3-/5    Right Shoulder ABduction 3-/5                           OT Education - 11/09/19 1702    Education Details Provided shoulder stretches    Person(s) Educated Patient    Methods Handout;Demonstration;Explanation    Comprehension Verbalized understanding;Returned demonstration            OT Short Term Goals - 11/09/19 1707      OT SHORT TERM GOAL #1   Title Patient will be educated and independent with HEP to increase functional performance during daily tasks    Time 4    Period Weeks    Status New    Target Date 12/09/19      OT SHORT TERM GOAL #2   Title Pt will increase RUE A/ROM to [facility] to improve ability to donn shirts with minimal compensatory strategies.    Time 4    Period Weeks    Status New      OT SHORT TERM GOAL #3   Title Patient will return to highest level of independence with all daily and work related tasks using RUE    Time 4    Period Weeks    Status New      OT SHORT TERM GOAL #4   Title Patient will increase RUE strength to 5/5 or greater to improve ability to complete lifting tasks at work.    Time 4    Period Weeks    Status New      OT SHORT TERM GOAL #5   Title Patient will decrease pain level in RUE to 3/10 or less when completing ADL tasks while incorporating proper body mechanics and stretches as needed.    Time 4    Period Weeks    Status New                    Plan - 11/09/19  1704    Clinical Impression Statement Pt is a 44 year old male presenting with left shoulder adhesive capsulitis limiting functional use of RUE during daily tasks. Pt limited by pain. Pt with min guarding limited PROM.    OT Occupational Profile and History Problem Focused Assessment - Including review of records  relating to presenting problem    Occupational performance deficits (Please refer to evaluation for details): ADL's;IADL's;Work;Play;Leisure    Body Structure / Function / Physical Skills ADL;Endurance;UE functional use;Fascial restriction;Pain;ROM;IADL;Strength    Rehab Potential Good    Clinical Decision Making Limited treatment options, no task modification necessary    Comorbidities Affecting Occupational Performance: None    Modification or Assistance to Complete Evaluation  No modification of tasks or assist necessary to complete eval    OT Frequency 2x / week    OT Duration 4 weeks    OT Treatment/Interventions Self-care/ADL training;Patient/family education;Passive range of motion;Moist Heat;Therapeutic exercise;Manual Therapy;Therapeutic activities;Electrical Stimulation;Ultrasound    Plan Pt will benefit from skilled OT services to decrease pain and fascial restrictions, increase ROM, strength, and functional use of LUE during ADLs. Treatment plan: myofascial release and manual techniques, P/ROM, AA/ROM, A/ROM, general LUE strengthening, modalities prn    OT Home Exercise Plan Eval: shoulder stretches    Consulted and Agree with Plan of Care Patient           Patient will benefit from skilled therapeutic intervention in order to improve the following deficits and impairments:   Body Structure / Function / Physical Skills: ADL, Endurance, UE functional use, Fascial restriction, Pain, ROM, IADL, Strength       Visit Diagnosis: Acute pain of right shoulder  Stiffness of right shoulder, not elsewhere classified  Other symptoms and signs involving the musculoskeletal system    Problem List Patient Active Problem List   Diagnosis Date Noted   Mild intermittent asthma, uncomplicated 08/05/2019   Seasonal and perennial allergic rhinitis 08/05/2019   Flexural atopic dermatitis 08/05/2019   Insect sting allergy 08/05/2019    Horris Latino, OTR/L 11/09/2019, 5:20  PM  [facility] [address] [place], [place], [address] Phone: [phone]   Fax:  [phone]  Name: Raymond Sanchez MRN: [id number] Date of Birth: Jun 02, 1975

## 2019-11-13 ENCOUNTER — Emergency Department (HOSPITAL_COMMUNITY)
Admission: EM | Admit: 2019-11-13 | Discharge: 2019-11-13 | Disposition: A | Discharge status: Home / Self Care | Payer: Managed Care, Other (non HMO) | Attending: Emergency Medicine | Admitting: Emergency Medicine

## 2019-11-13 ENCOUNTER — Encounter (HOSPITAL_COMMUNITY): Payer: Self-pay

## 2019-11-13 DIAGNOSIS — E119 Type 2 diabetes mellitus without complications: Secondary | ICD-10-CM | POA: Diagnosis not present

## 2019-11-13 DIAGNOSIS — I10 Essential (primary) hypertension: Secondary | ICD-10-CM | POA: Diagnosis not present

## 2019-11-13 DIAGNOSIS — Z87891 Personal history of nicotine dependence: Secondary | ICD-10-CM | POA: Diagnosis not present

## 2019-11-13 DIAGNOSIS — T7840XA Allergy, unspecified, initial encounter: Secondary | ICD-10-CM

## 2019-11-13 DIAGNOSIS — Z79899 Other long term (current) drug therapy: Secondary | ICD-10-CM | POA: Insufficient documentation

## 2019-11-13 DIAGNOSIS — T63441A Toxic effect of venom of bees, accidental (unintentional), initial encounter: Secondary | ICD-10-CM | POA: Diagnosis not present

## 2019-11-13 DIAGNOSIS — J4520 Mild intermittent asthma, uncomplicated: Secondary | ICD-10-CM | POA: Insufficient documentation

## 2019-11-13 MED ORDER — DIPHENHYDRAMINE HCL 50 MG/ML IJ SOLN
INTRAMUSCULAR | Status: AC
  Filled 2019-11-13: qty 1

## 2019-11-13 MED ORDER — METHYLPREDNISOLONE SODIUM SUCC 125 MG IJ SOLR
INTRAMUSCULAR | Status: AC
  Filled 2019-11-13: qty 2

## 2019-11-13 MED ORDER — SODIUM CHLORIDE 0.9 % IV BOLUS
1000.0000 mL | Freq: Once | INTRAVENOUS | Status: AC
  Administered 2019-11-13: 1000 mL via INTRAVENOUS

## 2019-11-13 MED ORDER — FAMOTIDINE IN NACL 20-0.9 MG/50ML-% IV SOLN
20.0000 mg | Freq: Once | INTRAVENOUS | Status: AC
  Administered 2019-11-13: 20 mg via INTRAVENOUS
  Filled 2019-11-13: qty 50

## 2019-11-13 MED ORDER — EPINEPHRINE 0.3 MG/0.3ML IJ SOAJ: 0.3000 mg | INTRAMUSCULAR | 0 refills | Status: DC | PRN

## 2019-11-13 NOTE — ED Triage Notes (Addendum)
Stung on top of head by bee.  Pt used epi pen. No swelling to lips or tongue. No hives noted on body

## 2019-11-13 NOTE — Discharge Instructions (Signed)
I refilled your EpiPen.  He can take Benadryl as needed for itching.  Return emergency department for any difficulty breathing, swelling of her tongue or lips, hives or any other worsening or concerning symptoms.

## 2019-11-13 NOTE — ED Provider Notes (Signed)
[facility] EMERGENCY DEPARTMENT Provider Note   CSN: [id number] Arrival date & time: 11/13/19  1554     History Chief Complaint  Patient presents with  . Insect Bite    Raymond Sanchez is a 44 y.o. male past medical history of diabetes, hypertension who presents for evaluation of insect bite.  Patient reports that he has a known history of allergy to yellow jackets.  He was prescribed an EpiPen.  He reports that he was at work today about 30 minutes prior to ED arrival and states that he was stung 2 times on the top of his head by a bee.  He states that he started to feeling like he was having trouble breathing so he gave himself an EpiPen.  He states that he has not had any swelling to his tongue or lips.  He reports feeling slightly better here in the ED.  Denies any difficulty swallowing, throat closing sensation, chest pain.  No other known allergies.  The history is provided by the patient.       Past Medical History:  Diagnosis Date  . Diabetes mellitus without complication (HCC)   . Flexural atopic dermatitis 08/05/2019  . Hypertension   . Mild intermittent asthma, uncomplicated 08/05/2019    Patient Active Problem List   Diagnosis Date Noted  . Mild intermittent asthma, uncomplicated 08/05/2019  . Seasonal and perennial allergic rhinitis 08/05/2019  . Flexural atopic dermatitis 08/05/2019  . Insect sting allergy 08/05/2019    Past Surgical History:  Procedure Laterality Date  . APPENDECTOMY    . ARTHROSCOPIC REPAIR ACL Right 2001  . CHOLECYSTECTOMY    . FOOT SURGERY    . KNEE ARTHROSCOPY         Family History  Problem Relation Age of Onset  . Allergic rhinitis Father   . Angioedema Neg Hx   . Asthma Neg Hx   . Atopy Neg Hx   . Eczema Neg Hx   . Immunodeficiency Neg Hx   . Urticaria Neg Hx     Social History   Tobacco Use  . Smoking status: Former Games developer  . Smokeless tobacco: Current User    Types: Chew, Snuff  Vaping Use  . Vaping Use: Never  used  Substance Use Topics  . Alcohol use: Yes    Comment: occ.   . Drug use: No    Home Medications Prior to Admission medications   Medication Sig Start Date End Date Taking? Authorizing Provider  azelastine (ASTELIN) 0.1 % nasal spray Use 2 sprays 1-2 times daily as needed. 08/03/19   Alfonse Spruce, MD  beclomethasone (QVAR REDIHALER) 80 MCG/ACT inhaler Inhale 2 puffs into the lungs 2 (two) times daily. 08/03/19   Alfonse Spruce, MD  empagliflozin (JARDIANCE) 25 MG TABS tablet Take 25 mg by mouth daily.    [provider]  EPINEPHrine 0.3 mg/0.3 mL IJ SOAJ injection Inject 0.3 mLs (0.3 mg total) into the muscle as needed for anaphylaxis. 11/13/19   Maxwell Caul, PA-C  fexofenadine (ALLEGRA) 180 MG tablet Take 180 mg by mouth daily.    [provider]  Fluticasone Propionate (XHANCE) 93 MCG/ACT EXHU Place 1-2 sprays into the nose 2 (two) times daily. 08/03/19   Alfonse Spruce, MD  levocetirizine (XYZAL) 5 MG tablet Take 1 tablet (5 mg total) by mouth every evening. 08/03/19   Alfonse Spruce, MD  montelukast (SINGULAIR) 10 MG tablet Take 1 tablet (10 mg total) by mouth at bedtime. 08/03/19  Alfonse Spruce, MD  Triamcinolone Acetonide (TRIAMCINOLONE 0.1 % CREAM : EUCERIN) CREA Apply 1 application topically 2 (two) times daily. 08/03/19   Alfonse Spruce, MD  triamcinolone ointment (KENALOG) 0.1 % Apply 1 application topically 2 (two) times daily. 08/03/19   Alfonse Spruce, MD    Allergies    Patient has no known allergies.  Review of Systems   Review of Systems  HENT: Negative for facial swelling.   Respiratory: Negative for cough and shortness of breath.   Cardiovascular: Negative for chest pain.  Gastrointestinal: Negative for abdominal pain, nausea and vomiting.  Genitourinary: Negative for dysuria and hematuria.  Neurological: Negative for headaches.  All other systems reviewed and are negative.   Physical  Exam Updated Vital Signs BP 136/88 (BP Location: Left Arm)   Pulse 69   Temp 98.6 F (37 C) (Oral)   Resp 14   SpO2 100%   Physical Exam Vitals and nursing note reviewed.  Constitutional:      Appearance: Normal appearance. He is well-developed.  HENT:     Head: Normocephalic and atraumatic.     Mouth/Throat:     Comments: Posterior oropharynx is clear without any signs of erythema, edema.  No oral angioedema.  Phonation is intact.  Airway is patent. Eyes:     General: Lids are normal.     Conjunctiva/sclera: Conjunctivae normal.     Pupils: Pupils are equal, round, and reactive to light.  Cardiovascular:     Rate and Rhythm: Normal rate and regular rhythm.     Pulses: Normal pulses.     Heart sounds: Normal heart sounds. No murmur heard.  No friction rub. No gallop.   Pulmonary:     Effort: Pulmonary effort is normal.     Breath sounds: Normal breath sounds.     Comments: Lungs clear to auscultation bilaterally.  Symmetric chest rise.  No wheezing, rales, rhonchi. Abdominal:     Palpations: Abdomen is soft. Abdomen is not rigid.     Tenderness: There is no abdominal tenderness. There is no guarding.  Musculoskeletal:        General: Normal range of motion.     Cervical back: Full passive range of motion without pain.  Skin:    General: Skin is warm and dry.     Capillary Refill: Capillary refill takes less than 2 seconds.     Comments: No rash.   Neurological:     Mental Status: He is alert and oriented to person, place, and time.  Psychiatric:        Speech: Speech normal.     ED Results / Procedures / Treatments   Labs (all labs ordered are listed, but only abnormal results are displayed) Labs Reviewed - No data to display  EKG None  Radiology No results found.  Procedures Procedures (including critical care time)  Medications Ordered in ED Medications  diphenhydrAMINE (BENADRYL) 50 MG/ML injection (  Given 11/13/19 1614)  methylPREDNISolone sodium  succinate (SOLU-MEDROL) 125 mg/2 mL injection (  Given 11/13/19 1615)  famotidine (PEPCID) IVPB 20 mg premix (0 mg Intravenous Stopped 11/13/19 1714)  sodium chloride 0.9 % bolus 1,000 mL (0 mLs Intravenous Stopped 11/13/19 1745)    ED Course  I have reviewed the triage vital signs and the nursing notes.  Pertinent labs & imaging results that were available during my care of the patient were reviewed by me and considered in my medical decision making (see chart for details).  MDM Rules/Calculators/A&P                          44 year old male who presents for evaluation of allergic reaction.  He was stung by a bee 30 minutes prior to ED arrival.  He self administered an EpiPen and then came to the emergency department.  He states that he felt like he was having some trouble breathing prior to EpiPen.  Feels improved after EpiPen.  He has known history of allergy to yellow jacket.  On ED arrival, he is afebrile, toxic appearing.  Vital signs are stable.  On exam, he has no signs of oral angioedema or anaphylaxis.  No rash.  We will plan for symptomatic care as well as monitoring.  Reevaluation.  Patient is resting comfortably with no signs of distress.  He denies any difficulty breathing, tongue or lip swelling.  He has been able tolerate p.o.  Reevaluation.  Patient has not been monitored in the ED for about 3 to of hours.  He is resting comfortably with no signs of distress.  Patient states he is ready to go home.  I discussed with patient that he has only been monitored in the ED for about 3-1/2 hours.  He does estimate that he got the EpiPen about 20-30 minutes before coming to the ED.  In totality, his time after EpiPen has been close to 4 hours with no signs of rebounding effects.  I discussed with patient regarding monitoring him until full 4-hour mark.  Patient would like to go ahead and be discharged home.  I discussed risk versus benefits of going home, including but not limited to rebound  symptoms.  Patient understands and wishes to be discharged at this time.  I feel this is reasonable given his continued well appearance in the ED and the fact that it is most likely been more than 4 hours since he got the EpiPen.  Will refill his EpiPen.  Patient had ample opportunity for questions and discussion. All patient's questions were answered with full understanding. Strict return precautions discussed. Patient expresses understanding and agreement to plan.   Portions of this note were generated with Scientist, clinical (histocompatibility and immunogenetics). Dictation errors may occur despite best attempts at proofreading.   Final Clinical Impression(s) / ED Diagnoses Final diagnoses:  Allergic reaction, initial encounter    Rx / DC Orders ED Discharge Orders         Ordered    EPINEPHrine 0.3 mg/0.3 mL IJ SOAJ injection  As needed     Discontinue  Reprint     11/13/19 1932           Maxwell Caul, PA-C 11/13/19 2133    Raeford Razor, MD 11/15/19 [phone]

## 2019-11-21 ENCOUNTER — Telehealth (HOSPITAL_COMMUNITY): Payer: Self-pay

## 2019-11-21 ENCOUNTER — Ambulatory Visit (HOSPITAL_COMMUNITY): Payer: Managed Care, Other (non HMO) | Attending: Orthopedic Surgery

## 2019-11-21 NOTE — Telephone Encounter (Signed)
Called and left message regarding no show. Reminded patient of next therapy appointment and to call if he is unable to make it.   Limmie Patricia, OTR/L,CBIS  [phone]

## 2019-11-28 ENCOUNTER — Ambulatory Visit (HOSPITAL_COMMUNITY): Payer: Managed Care, Other (non HMO)

## 2019-11-28 ENCOUNTER — Telehealth (HOSPITAL_COMMUNITY): Payer: Self-pay

## 2019-11-28 NOTE — Telephone Encounter (Signed)
He was called into work

## 2019-11-30 ENCOUNTER — Ambulatory Visit (HOSPITAL_COMMUNITY): Payer: Managed Care, Other (non HMO) | Admitting: Occupational Therapy

## 2019-11-30 ENCOUNTER — Telehealth (HOSPITAL_COMMUNITY): Payer: Self-pay

## 2019-11-30 NOTE — Telephone Encounter (Signed)
pt is still out of town for work and will return to OT on 8/31

## 2019-12-11 ENCOUNTER — Encounter (HOSPITAL_COMMUNITY): Payer: 59 | Admitting: Occupational Therapy

## 2019-12-11 ENCOUNTER — Telehealth (HOSPITAL_COMMUNITY): Payer: Self-pay | Admitting: Occupational Therapy

## 2019-12-11 NOTE — Telephone Encounter (Signed)
pt cancelled appt because he has a earache and a sore throat

## 2019-12-13 ENCOUNTER — Encounter (HOSPITAL_COMMUNITY): Payer: 59

## 2019-12-18 ENCOUNTER — Encounter (HOSPITAL_COMMUNITY): Payer: 59

## 2019-12-18 ENCOUNTER — Telehealth (HOSPITAL_COMMUNITY): Payer: Self-pay

## 2019-12-18 NOTE — Telephone Encounter (Signed)
Pt called he has a cold (covid test negative) he will return on 915/2021 Please cx 9/7 and 9/8.

## 2019-12-19 ENCOUNTER — Encounter (HOSPITAL_COMMUNITY): Payer: Managed Care, Other (non HMO) | Admitting: Occupational Therapy

## 2019-12-20 ENCOUNTER — Encounter (HOSPITAL_COMMUNITY): Payer: 59

## 2019-12-25 ENCOUNTER — Encounter (HOSPITAL_COMMUNITY): Payer: 59

## 2019-12-26 ENCOUNTER — Ambulatory Visit (HOSPITAL_COMMUNITY): Payer: Managed Care, Other (non HMO) | Attending: Orthopedic Surgery | Admitting: Occupational Therapy

## 2019-12-26 ENCOUNTER — Encounter (HOSPITAL_COMMUNITY): Payer: Self-pay | Admitting: Occupational Therapy

## 2019-12-26 ENCOUNTER — Other Ambulatory Visit: Payer: Self-pay

## 2019-12-26 DIAGNOSIS — R29898 Other symptoms and signs involving the musculoskeletal system: Secondary | ICD-10-CM | POA: Insufficient documentation

## 2019-12-26 DIAGNOSIS — M25611 Stiffness of right shoulder, not elsewhere classified: Secondary | ICD-10-CM

## 2019-12-26 DIAGNOSIS — M25511 Pain in right shoulder: Secondary | ICD-10-CM | POA: Diagnosis not present

## 2019-12-27 ENCOUNTER — Encounter (HOSPITAL_COMMUNITY): Payer: 59

## 2019-12-28 NOTE — Therapy (Signed)
[facility] [place] [place], [place], [address] Phone: [phone]   Fax:  [phone]  Occupational Therapy Reassessment & Treatment (recertification)  Patient Details  Name: Raymond Sanchez MRN: [id number] Date of Birth: 05-14-1975 Referring Provider (OT): Dr. Justice Britain    Encounter Date: 12/26/2019   OT End of Session - 12/28/19 0841    Visit Number 2    Number of Visits 8    Date for OT Re-Evaluation 01/27/20    Authorization Type UMR    Authorization Time Period 25 Visit limit    Authorization - Visit Number 2    Authorization - Number of Visits 25    OT Start Time 7517    OT Stop Time 1730    OT Time Calculation (min) 42 min    Activity Tolerance Patient tolerated treatment well    Behavior During Therapy [facility] for tasks assessed/performed           Past Medical History:  Diagnosis Date  . Diabetes mellitus without complication ([place])   . Flexural atopic dermatitis 08/05/2019  . Hypertension   . Mild intermittent asthma, uncomplicated 0/04/7492    Past Surgical History:  Procedure Laterality Date  . APPENDECTOMY    . ARTHROSCOPIC REPAIR ACL Right 2001  . CHOLECYSTECTOMY    . FOOT SURGERY    . KNEE ARTHROSCOPY      There were no vitals filed for this visit.       [facility] OT Assessment - 12/28/19 0001      Assessment   Medical Diagnosis right shoulder adhesive capsulitis      Precautions   Precautions None      Palpation   Palpation comment mod fascial restrictions along upper arm, trapezius, and scapular regions      AROM   Overall AROM Comments Assessed seated    Right Shoulder Flexion 120 Degrees   90 previous   Right Shoulder ABduction 145 Degrees   95 previous   Right Shoulder Internal Rotation 90 Degrees   not previously assessed   Right Shoulder External Rotation 38 Degrees   not previously assessed     PROM   Overall PROM Comments Assessed supine, er/IR adducted    Right Shoulder Flexion  145 Degrees   95 previous     Strength   Overall Strength Comments Assessed seated    Right Shoulder Flexion 5/5   3-/5 previous   Right Shoulder ABduction 4+/5   3-/5 previous   Right Shoulder Internal Rotation 5/5   not previously assessed   Right Shoulder External Rotation 5/5   not previously assessed                   OT Treatments/Exercises (OP) - 12/28/19 0001      Exercises   Exercises Shoulder      Shoulder Exercises: Supine   Protraction PROM;5 reps    Horizontal ABduction PROM;5 reps    External Rotation PROM;5 reps    Internal Rotation PROM;5 reps    Flexion PROM;5 reps    ABduction PROM;5 reps      Shoulder Exercises: Standing   Horizontal ABduction AROM;10 reps    External Rotation AROM;10 reps    Flexion AROM;10 reps    ABduction AROM;10 reps      Shoulder Exercises: Pulleys   Flexion 2 minutes    ABduction 2 minutes      Manual Therapy   Manual Therapy Soft tissue mobilization;Muscle Energy Technique  Manual therapy comments completed separately from therapeutic exercises    Soft tissue mobilization right subscapularis completed release to decrease muscle tightness and increase joint ROM    Muscle Energy Technique muscle energy technique to right external rotators to decrease muscle spasms and increase joint ROM                    OT Short Term Goals - 12/28/19 0845      OT SHORT TERM GOAL #1   Title Patient will be educated and independent with HEP to increase functional performance during daily tasks    Time 4    Period Weeks    Status On-going    Target Date 01/27/20      OT SHORT TERM GOAL #2   Title Pt will increase RUE A/ROM 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    Baseline er continues to be severely limited    Time 4    Period Weeks    Status Partially Met      OT SHORT TERM GOAL #3   Title Patient will return to highest level of independence with all daily and work  related tasks using RUE    Time 4    Period Weeks    Status On-going      OT SHORT TERM GOAL #4   Title Patient will increase RUE strength to 5/5 or greater to improve ability to complete lifting tasks at work.    Time 4    Period Weeks    Status Partially Met      OT SHORT TERM GOAL #5   Title Patient will decrease pain level in RUE to 3/10 or less when completing ADL tasks while incorporating proper body mechanics and stretches as needed.    Time 4    Period Weeks    Status On-going      Additional Short Term Goals   Additional Short Term Goals Yes      OT SHORT TERM GOAL #6   Title Pt will decrease fascial restrictions in RUE to minimal amounts or less to improve mobility required for functional reaching tasks.    Time 4    Period Weeks    Status New                    Plan - 12/28/19 9150    Clinical Impression Statement A: Reassessment completed this session. Pt has not been seen since evaluation due to work schedule. Pt continues to have shoulder pain and discomfort, he has been completing HEP and does demonstrate improved ROM and strength from evaluation. Pt has partially met two goals at this time. Pt continues to be severely limited in er, has good response to manual techniques this session. After manual therapy and passive stretching pt demonstrating full ROM for flexion and abduction, approximately 50% ROM for er. Pt would like to continue with additional therapy, discussed work schedule and therapy options including HEP or later appointment times with less frequency. Pt would like to try our latest appointment times and HEP between sessions. Assisted in scheduling appointments for 4 weeks.    OT Occupational Profile and History Problem Focused Assessment - Including review of records relating to presenting problem    Occupational performance deficits (Please refer to evaluation for details): ADL's;IADL's;Work;Play;Leisure    Body Structure / Function / Physical  Skills ADL;Endurance;UE functional use;Fascial restriction;Pain;ROM;IADL;Strength    Rehab Potential Good    Clinical Decision  Making Limited treatment options, no task modification necessary    Comorbidities Affecting Occupational Performance: None    Modification or Assistance to Complete Evaluation  No modification of tasks or assist necessary to complete eval    OT Frequency 1x / week    OT Duration 4 weeks    OT Treatment/Interventions Self-care/ADL training;Patient/family education;Passive range of motion;Moist Heat;Therapeutic exercise;Manual Therapy;Therapeutic activities;Electrical Stimulation;Ultrasound    Plan P: Continue with skilled OT services to improve functional use of RUE. Next session: manual techniques, continue A/ROM and update HEP    OT Home Exercise Plan Eval: shoulder stretches    Consulted and Agree with Plan of Care Patient           Patient will benefit from skilled therapeutic intervention in order to improve the following deficits and impairments:   Body Structure / Function / Physical Skills: ADL, Endurance, UE functional use, Fascial restriction, Pain, ROM, IADL, Strength       Visit Diagnosis: Acute pain of right shoulder  Stiffness of right shoulder, not elsewhere classified  Other symptoms and signs involving the musculoskeletal system    Problem List Patient Active Problem List   Diagnosis Date Noted  . Mild intermittent asthma, uncomplicated 97/98/9211  . Seasonal and perennial allergic rhinitis 08/05/2019  . Flexural atopic dermatitis 08/05/2019  . Insect sting allergy 08/05/2019   Guadelupe Sabin, OTR/L  [phone] 12/28/2019, 8:52 AM  [facility] [address] [place], [place], [address] Phone: [phone]   Fax:  [phone]  Name: Raymond Sanchez MRN: [id number] Date of Birth: Aug 14, 1975

## 2020-01-09 ENCOUNTER — Ambulatory Visit (HOSPITAL_COMMUNITY): Payer: Managed Care, Other (non HMO)

## 2020-01-09 ENCOUNTER — Telehealth (HOSPITAL_COMMUNITY): Payer: Self-pay

## 2020-01-09 NOTE — Telephone Encounter (Signed)
Called patient regarding no show. Left message. Informed patient of attendance policy. If he has another no show he will only be able to schedule one appointment at a time and if he has an additional no show he will be discharged. Reminded patient of next appointment and to call if he is unable to make it.   Limmie Patricia, OTR/L,CBIS  [phone]

## 2020-01-15 ENCOUNTER — Encounter (HOSPITAL_COMMUNITY): Payer: Self-pay

## 2020-01-15 ENCOUNTER — Ambulatory Visit (HOSPITAL_COMMUNITY): Payer: Managed Care, Other (non HMO) | Attending: Orthopedic Surgery

## 2020-01-15 ENCOUNTER — Other Ambulatory Visit: Payer: Self-pay

## 2020-01-15 DIAGNOSIS — M25511 Pain in right shoulder: Secondary | ICD-10-CM | POA: Diagnosis present

## 2020-01-15 DIAGNOSIS — R29898 Other symptoms and signs involving the musculoskeletal system: Secondary | ICD-10-CM

## 2020-01-15 DIAGNOSIS — M25611 Stiffness of right shoulder, not elsewhere classified: Secondary | ICD-10-CM | POA: Diagnosis present

## 2020-01-15 NOTE — Patient Instructions (Signed)
Repeat all exercises 10 times, at least once a day.  1) Shoulder Protraction    Begin with elbows by your side, slowly "punch" straight out in front of you.      2) Shoulder Flexion  Standing:         Begin with arms at your side with thumbs pointed up, slowly raise both arms up and forward towards overhead.    3) Horizontal abduction/adduction  Standing:           Begin with arms straight out in front of you, bring out to the side in at "T" shape. Keep arms straight entire time.    4) Internal & External Rotation  Standing:    Stand with elbows at the side and elbows bent 90 degrees. Move your forearms away from your body, then bring back inward toward the body.     5) Shoulder Abduction  Standing:       Lying on your back begin with your arms flat on the table next to your side. Slowly move your arms out to the side so that they go overhead, in a jumping jack or snow angel movement.

## 2020-01-15 NOTE — Therapy (Addendum)
[place], [place], [address] Phone: [phone]   Fax:  [phone]  Occupational Therapy Treatment  Patient Details  Name: Raymond Sanchez MRN: [id number] Date of Birth: February 24, 1976 Referring Provider (OT): Dr. Justice Britain    Encounter Date: 01/15/2020   OT End of Session - 01/15/20 1736    Visit Number 3    Number of Visits 8    Date for OT Re-Evaluation 01/27/20    Authorization Type UMR    Authorization Time Period 25 Visit limit    Authorization - Visit Number 3    Authorization - Number of Visits 25    OT Start Time 0737    OT Stop Time 1725    OT Time Calculation (min) 40 min    Activity Tolerance Patient tolerated treatment well    Behavior During Therapy [facility] for tasks assessed/performed           Past Medical History:  Diagnosis Date  . Diabetes mellitus without complication ([place])   . Flexural atopic dermatitis 08/05/2019  . Hypertension   . Mild intermittent asthma, uncomplicated 04/17/2692    Past Surgical History:  Procedure Laterality Date  . APPENDECTOMY    . ARTHROSCOPIC REPAIR ACL Right 2001  . CHOLECYSTECTOMY    . FOOT SURGERY    . KNEE ARTHROSCOPY      There were no vitals filed for this visit.   Subjective Assessment - 01/15/20 1648    Subjective  S: I got my flu shot in my same arm so it's pretty sore    Currently in Pain? Yes    Pain Score 2     Pain Location Shoulder    Pain Orientation Right    Pain Descriptors / Indicators Sharp    Pain Type Acute pain              [facility] OT Assessment - 01/16/20 0857      Assessment   Medical Diagnosis right shoulder adhesive capsulitis      Precautions   Precautions None                    OT Treatments/Exercises (OP) - 01/15/20 1650      Exercises   Exercises Shoulder      Shoulder Exercises: Supine   Protraction PROM;5 reps    Horizontal ABduction PROM;5 reps    External Rotation PROM;5 reps     Internal Rotation PROM;5 reps    Flexion PROM;5 reps    ABduction PROM;5 reps      Shoulder Exercises: Standing   Protraction AROM;10 reps    Horizontal ABduction AROM;10 reps    External Rotation AROM;10 reps    Internal Rotation AROM;10 reps    Flexion AROM;10 reps    ABduction AROM;10 reps      Shoulder Exercises: ROM/Strengthening   Other ROM/Strengthening Exercises Proximal shoulder strengthening using green ball on the wall, flexion 1' abduction 1'      Manual Therapy   Manual Therapy Soft tissue mobilization;Muscle Energy Technique    Manual therapy comments completed separately from therapeutic exercises    Soft tissue mobilization right subscapularis completed release to decrease muscle tightness and increase joint ROM    Muscle Energy Technique muscle energy technique to right external rotators to decrease muscle spasms and increase joint ROM                  OT Education - 01/15/20 1735  Education Details A/ROM standing, discussed frozen shoulder diagnosis and lack of clinical evidence for what causes it    Person(s) Educated Patient    Methods Explanation;Handout;Verbal cues;Demonstration    Comprehension Verbalized understanding;Returned demonstration;Verbal cues required            OT Short Term Goals - 01/16/20 0858      OT SHORT TERM GOAL #1   Title Patient will be educated and independent with HEP to increase functional performance during daily tasks    Time 4    Period Weeks    Status On-going    Target Date 01/27/20      OT SHORT TERM GOAL #2   Title Pt will increase RUE A/ROM to [facility] to improve ability to donn shirts with minimal compensatory strategies.    Baseline er continues to be severely limited    Time 4    Period Weeks    Status Partially Met      OT SHORT TERM GOAL #3   Title Patient will return to highest level of independence with all daily and work related tasks using RUE    Time 4    Period Weeks    Status On-going       OT SHORT TERM GOAL #4   Title Patient will increase RUE strength to 5/5 or greater to improve ability to complete lifting tasks at work.    Time 4    Period Weeks    Status Partially Met      OT SHORT TERM GOAL #5   Title Patient will decrease pain level in RUE to 3/10 or less when completing ADL tasks while incorporating proper body mechanics and stretches as needed.    Time 4    Period Weeks    Status On-going      OT SHORT TERM GOAL #6   Title Pt will decrease fascial restrictions in RUE to minimal amounts or less to improve mobility required for functional reaching tasks.    Time 4    Period Weeks    Status On-going                    Plan - 01/15/20 1739    Clinical Impression Statement A: Continued manual techniques to the RUE, with mod fasical restrictions palpated on the scapularis region. Completed passive stretches with muscle energy technique for ER, with slight improvement in available range. Began all A/ROM exercises in standing, and completed ball on wall proximal shoulder strengthening. VC and demonstration required for form.    Occupational performance deficits (Please refer to evaluation for details): ADL's;IADL's;Work;Play;Leisure    Body Structure / Function / Physical Skills ADL;Endurance;UE functional use;Fascial restriction;Pain;ROM;IADL;Strength    Plan P: Re-eval is due 10/17. Continue manual techniques and muscle energy for ER during passive stretches. Continue A/ROM. Wall wash    OT Home Exercise Plan Eval: shoulder stretches, 10/6: A/ROM standing,    Consulted and Agree with Plan of Care Patient           Patient will benefit from skilled therapeutic intervention in order to improve the following deficits and impairments:   Body Structure / Function / Physical Skills: ADL, Endurance, UE functional use, Fascial restriction, Pain, ROM, IADL, Strength       Visit Diagnosis: Acute pain of right shoulder  Stiffness of right shoulder, not  elsewhere classified  Other symptoms and signs involving the musculoskeletal system    Problem List Patient Active Problem List   Diagnosis Date Noted  .  Mild intermittent asthma, uncomplicated 41/32/4401  . Seasonal and perennial allergic rhinitis 08/05/2019  . Flexural atopic dermatitis 08/05/2019  . Insect sting allergy 08/05/2019     [place], [place] Student [phone]   01/16/2020, 8:58 AM  [place] [address] [place], [place], [address] Phone: [phone]   Fax:  [phone]  Name: VERLAN GROTZ MRN: [id number] Date of Birth: 1975/07/02

## 2020-01-22 ENCOUNTER — Other Ambulatory Visit: Payer: Self-pay

## 2020-01-22 ENCOUNTER — Encounter (HOSPITAL_COMMUNITY): Payer: Self-pay

## 2020-01-22 ENCOUNTER — Ambulatory Visit (HOSPITAL_COMMUNITY): Payer: Managed Care, Other (non HMO)

## 2020-01-22 DIAGNOSIS — M25511 Pain in right shoulder: Secondary | ICD-10-CM | POA: Diagnosis not present

## 2020-01-22 DIAGNOSIS — R29898 Other symptoms and signs involving the musculoskeletal system: Secondary | ICD-10-CM

## 2020-01-22 DIAGNOSIS — M25611 Stiffness of right shoulder, not elsewhere classified: Secondary | ICD-10-CM

## 2020-01-22 NOTE — Therapy (Addendum)
[place], [place], [address] Phone: [phone]   Fax:  [phone]  Occupational Therapy Treatment  Patient Details  Name: Raymond Sanchez MRN: [id number] Date of Birth: [birth year] Referring Provider (OT): Dr. Justice Britain    Encounter Date: 01/22/2020   OT End of Session - 01/22/20 1722    Visit Number 4    Number of Visits 8    Date for OT Re-Evaluation 01/29/20    Authorization Type UMR    Authorization Time Period 25 Visit limit    Authorization - Visit Number 4    Authorization - Number of Visits 25    OT Start Time 0973    OT Stop Time 1718    OT Time Calculation (min) 42 min    Activity Tolerance Patient tolerated treatment well    Behavior During Therapy [facility] for tasks assessed/performed           Past Medical History:  Diagnosis Date  . Diabetes mellitus without complication ([place])   . Flexural atopic dermatitis 08/05/2019  . Hypertension   . Mild intermittent asthma, uncomplicated 5/32/9924    Past Surgical History:  Procedure Laterality Date  . APPENDECTOMY    . ARTHROSCOPIC REPAIR ACL Right 2001  . CHOLECYSTECTOMY    . FOOT SURGERY    . KNEE ARTHROSCOPY      There were no vitals filed for this visit.   Subjective Assessment - 01/22/20 1640    Subjective  S: I had a pretty slow day so my shoulder isn't bothering me    Currently in Pain? No/denies              [facility] OT Assessment - 01/22/20 1722      Assessment   Medical Diagnosis right shoulder adhesive capsulitis      Precautions   Precautions None                    OT Treatments/Exercises (OP) - 01/22/20 1640      Exercises   Exercises Shoulder      Shoulder Exercises: Supine   Protraction PROM;5 reps    Horizontal ABduction PROM;5 reps    External Rotation PROM;5 reps    Internal Rotation PROM;5 reps    Flexion PROM;5 reps    ABduction PROM;5 reps      Shoulder Exercises: Standing   Horizontal  ABduction AROM;12 reps    External Rotation AROM;12 reps    Internal Rotation AROM;12 reps    Flexion AROM;12 reps    ABduction AROM;12 reps    Extension AROM;10 reps;Theraband    Theraband Level (Shoulder Extension) Level 2 (Red)    Row AROM;10 reps;Theraband    Theraband Level (Shoulder Row) Level 2 (Red)      Shoulder Exercises: Therapy Ball   Flexion Right;10 reps      Shoulder Exercises: ROM/Strengthening   Proximal Shoulder Strengthening, Supine 10X    Proximal Shoulder Strengthening, Seated 10X    Prot/Ret//Elev/Dep 30" pro/ret, 30" elev/dep      Shoulder Exercises: Stretch   Corner Stretch 5 reps;10 seconds      Manual Therapy   Manual Therapy --    Manual therapy comments --    Soft tissue mobilization --    Muscle Energy Technique muscle energy technique to right external rotators to decrease muscle spasms and increase joint ROM  OT Short Term Goals - 01/16/20 0858      OT SHORT TERM GOAL #1   Title Patient will be educated and independent with HEP to increase functional performance during daily tasks    Time 4    Period Weeks    Status On-going    Target Date 01/27/20      OT SHORT TERM GOAL #2   Title Pt will increase RUE A/ROM to [facility] to improve ability to donn shirts with minimal compensatory strategies.    Baseline er continues to be severely limited    Time 4    Period Weeks    Status Partially Met      OT SHORT TERM GOAL #3   Title Patient will return to highest level of independence with all daily and work related tasks using RUE    Time 4    Period Weeks    Status On-going      OT SHORT TERM GOAL #4   Title Patient will increase RUE strength to 5/5 or greater to improve ability to complete lifting tasks at work.    Time 4    Period Weeks    Status Partially Met      OT SHORT TERM GOAL #5   Title Patient will decrease pain level in RUE to 3/10 or less when completing ADL tasks while incorporating proper body  mechanics and stretches as needed.    Time 4    Period Weeks    Status On-going      OT SHORT TERM GOAL #6   Title Pt will decrease fascial restrictions in RUE to minimal amounts or less to improve mobility required for functional reaching tasks.    Time 4    Period Weeks    Status On-going                    Plan - 01/22/20 1723    Clinical Impression Statement A: Continued muscle energy techniques with ER, with slightly more available range with passive stretching demonstrated. Introduced ball on wall flexion stretch, corner stretch and red band scapular strengthening this session, with VC required for form and technique. Patient attempted shoulder retraction with theraband but expressed discomfort and increased pain, and discontinued.    Body Structure / Function / Physical Skills ADL;Endurance;UE functional use;Fascial restriction;Pain;ROM;IADL;Strength    Plan P: Re-assess and possible d/c if ready, continue manual techniques if needed, use muscle energy for ER during passive stretching, re-attempt red scap band retraction then provide other scapular strengthening for HEP    Consulted and Agree with Plan of Care Patient           Patient will benefit from skilled therapeutic intervention in order to improve the following deficits and impairments:   Body Structure / Function / Physical Skills: ADL, Endurance, UE functional use, Fascial restriction, Pain, ROM, IADL, Strength       Visit Diagnosis: Acute pain of right shoulder  Stiffness of right shoulder, not elsewhere classified  Other symptoms and signs involving the musculoskeletal system    Problem List Patient Active Problem List   Diagnosis Date Noted  . Mild intermittent asthma, uncomplicated 12/45/8099  . Seasonal and perennial allergic rhinitis 08/05/2019  . Flexural atopic dermatitis 08/05/2019  . Insect sting allergy 08/05/2019     [place], [place] Student [phone]   01/23/2020, 8:44  AM  [place] [address] [place], [place], [address] Phone: [phone]   Fax:  [phone]  Name: JENCARLOS NICOLSON MRN: [id number] Date of Birth: [birth year]

## 2020-01-29 ENCOUNTER — Ambulatory Visit (HOSPITAL_COMMUNITY): Payer: Managed Care, Other (non HMO)

## 2020-01-29 ENCOUNTER — Telehealth (HOSPITAL_COMMUNITY): Payer: Self-pay

## 2020-01-29 NOTE — Telephone Encounter (Signed)
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

## 2020-02-05 ENCOUNTER — Telehealth (HOSPITAL_COMMUNITY): Payer: Self-pay

## 2020-02-05 ENCOUNTER — Ambulatory Visit (HOSPITAL_COMMUNITY): Payer: Managed Care, Other (non HMO)

## 2020-02-05 NOTE — Telephone Encounter (Signed)
pt called to cx today's appt due to he is stuck at work

## 2020-05-08 ENCOUNTER — Encounter (HOSPITAL_COMMUNITY): Payer: Self-pay

## 2020-05-08 NOTE — Therapy (Signed)
[place], [place], [address] Phone: [phone]   Fax:  [phone]  Patient Details  Name: BLISS TSANG MRN: [id number] Date of Birth: 06-20-1975 Referring Provider:  No ref. provider found  Encounter Date: 05/08/2020  OCCUPATIONAL THERAPY DISCHARGE SUMMARY  Visits from Start of Care: 4 Current functional level related to goals / functional outcomes: Patient did not return since last OT session on 01/11/20 and will be discharged from therapy services.    Remaining deficits: Decreased ROM, strength, and increased fascial restrictions and pain   Education / Equipment: Shoulder HEP Plan: Patient agrees to discharge.  Patient goals were partially met. Patient is being discharged due to not returning since the last visit.  ?????           Ailene Ravel, OTR/L,CBIS  [phone]  05/08/2020, 4:41 PM  [place] [address] [place], [place], [address] Phone: [phone]   Fax:  [phone]

## 2021-04-07 ENCOUNTER — Other Ambulatory Visit: Payer: Self-pay | Admitting: Family Medicine

## 2021-04-07 ENCOUNTER — Other Ambulatory Visit (HOSPITAL_COMMUNITY): Payer: Self-pay | Admitting: Family Medicine

## 2021-04-07 DIAGNOSIS — N50812 Left testicular pain: Secondary | ICD-10-CM

## 2021-04-07 DIAGNOSIS — N50819 Testicular pain, unspecified: Secondary | ICD-10-CM

## 2021-04-08 ENCOUNTER — Ambulatory Visit (HOSPITAL_COMMUNITY)
Admission: RE | Admit: 2021-04-08 | Discharge: 2021-04-08 | Disposition: A | Discharge status: Home / Self Care | Payer: Managed Care, Other (non HMO) | Source: Ambulatory Visit | Attending: Family Medicine | Admitting: Family Medicine

## 2021-04-08 ENCOUNTER — Other Ambulatory Visit: Payer: Self-pay

## 2021-04-08 DIAGNOSIS — N50819 Testicular pain, unspecified: Secondary | ICD-10-CM | POA: Diagnosis present

## 2021-04-08 DIAGNOSIS — N50812 Left testicular pain: Secondary | ICD-10-CM | POA: Diagnosis present

## 2021-04-10 ENCOUNTER — Ambulatory Visit (HOSPITAL_COMMUNITY)
Admission: RE | Admit: 2021-04-10 | Discharge: 2021-04-10 | Disposition: A | Discharge status: Home / Self Care | Payer: Managed Care, Other (non HMO) | Source: Ambulatory Visit | Attending: Urology | Admitting: Urology

## 2021-04-10 ENCOUNTER — Encounter: Payer: Self-pay | Admitting: Urology

## 2021-04-10 ENCOUNTER — Ambulatory Visit (INDEPENDENT_AMBULATORY_CARE_PROVIDER_SITE_OTHER): Payer: Managed Care, Other (non HMO) | Admitting: Urology

## 2021-04-10 ENCOUNTER — Ambulatory Visit: Payer: Self-pay

## 2021-04-10 ENCOUNTER — Other Ambulatory Visit: Payer: Self-pay

## 2021-04-10 ENCOUNTER — Other Ambulatory Visit: Payer: Self-pay | Admitting: Physician Assistant

## 2021-04-10 VITALS — BP 133/77 | HR 121 | Ht 78.0 in | Wt 247.0 lb

## 2021-04-10 DIAGNOSIS — N200 Calculus of kidney: Secondary | ICD-10-CM

## 2021-04-10 DIAGNOSIS — N419 Inflammatory disease of prostate, unspecified: Secondary | ICD-10-CM | POA: Insufficient documentation

## 2021-04-10 DIAGNOSIS — N410 Acute prostatitis: Secondary | ICD-10-CM

## 2021-04-10 LAB — MICROSCOPIC EXAMINATION: Renal Epithel, UA: NONE SEEN /hpf

## 2021-04-10 LAB — URINALYSIS, ROUTINE W REFLEX MICROSCOPIC
Bilirubin, UA: NEGATIVE
Glucose, UA: NEGATIVE
Leukocytes,UA: NEGATIVE
Nitrite, UA: NEGATIVE
Specific Gravity, UA: >1.03 — ABNORMAL HIGH (ref 1.005–1.030)
Urobilinogen, Ur: 0.2 mg/dL (ref 0.2–1.0)
pH, UA: 5 (ref 5.0–7.5)

## 2021-04-10 MED ORDER — SULFAMETHOXAZOLE-TRIMETHOPRIM 800-160 MG PO TABS: 1.0000 | ORAL_TABLET | Freq: Two times a day (BID) | ORAL | 0 refills | Status: DC

## 2021-04-10 MED ORDER — TAMSULOSIN HCL 0.4 MG PO CAPS: 0.4000 mg | ORAL_CAPSULE | Freq: Every day | ORAL | 1 refills | Status: AC

## 2021-04-10 MED ORDER — CEFTRIAXONE SODIUM 1 G IJ SOLR
1.0000 g | Freq: Once | INTRAMUSCULAR | Status: AC
  Administered 2021-04-10: 13:00:00 1 g via INTRAMUSCULAR

## 2021-04-10 MED ORDER — LIDOCAINE HCL 1 % IJ SOLN
2.1000 mL | Freq: Once | INTRAMUSCULAR | Status: AC
Start: 2021-04-10 — End: 2021-04-10
  Administered 2021-04-10: 13:00:00 2.1 mL

## 2021-04-10 NOTE — Progress Notes (Signed)
04/10/2021 12:50 PM   Raymond Sanchez Aug 24, 1975 [id number]  Referring provider: Lovey Newcomer, PA [address] [place],  [place] [address]  Pelvic pain   HPI: Raymond Sanchez is a 45yo here for evaluation of pelvic pain. Starting 6-7 days ago he developed pelvic pain, urinary frequency, urgency, and dribbling. He had a scrotal US 04/08/2021 showed a left varicocele.He then developed low back pain 2-3 days ago and difficulty sitting. He feels he has to strain to urinate and dribbles purulent urine. No fevers. He has a hx of nephrolithiasis. KUB from today shows no obvious calculi. No other complaints today   PMH: Past Medical History:  Diagnosis Date   Diabetes mellitus without complication ([facility])    Flexural atopic dermatitis 08/05/2019   Hypertension    Mild intermittent asthma, uncomplicated 08/05/2019    Surgical History: Past Surgical History:  Procedure Laterality Date   APPENDECTOMY     ARTHROSCOPIC REPAIR ACL Right 2001   CHOLECYSTECTOMY     FOOT SURGERY     KNEE ARTHROSCOPY      Home Medications:  Allergies as of 04/10/2021   No Known Allergies      Medication List        Accurate as of April 10, 2021 12:50 PM. If you have any questions, ask your nurse or doctor.          STOP taking these medications    azelastine 0.1 % nasal spray Commonly known as: ASTELIN Stopped by: Wilkie Aye, MD   EPINEPHrine 0.3 mg/0.3 mL Soaj injection Commonly known as: EPI-PEN Stopped by: Wilkie Aye, MD   fexofenadine 180 MG tablet Commonly known as: ALLEGRA Stopped by: Wilkie Aye, MD   Charmayne Sheer MCG/ACT Exhu Generic drug: Fluticasone Propionate Stopped by: Wilkie Aye, MD       TAKE these medications    albuterol 108 (90 Base) MCG/ACT inhaler Commonly known as: VENTOLIN HFA SMARTSIG:2 Puff(s) By Mouth Every 4-6 Hours   Jardiance 25 MG Tabs tablet Generic drug: empagliflozin Take 25 mg by mouth daily.   levocetirizine 5 MG  tablet Commonly known as: XYZAL Take 1 tablet (5 mg total) by mouth every evening.   levofloxacin 500 MG tablet Commonly known as: LEVAQUIN Take 500 mg by mouth daily.   montelukast 10 MG tablet Commonly known as: Singulair Take 1 tablet (10 mg total) by mouth at bedtime.   Qvar RediHaler 80 MCG/ACT inhaler Generic drug: beclomethasone Inhale 2 puffs into the lungs 2 (two) times daily.   traMADol 50 MG tablet Commonly known as: ULTRAM Take 50-100 mg by mouth 3 (three) times daily.   triamcinolone 0.1 % cream : eucerin Crea Apply 1 application topically 2 (two) times daily.   triamcinolone ointment 0.1 % Commonly known as: KENALOG Apply 1 application topically 2 (two) times daily.        Allergies: No Known Allergies  Family History: Family History  Problem Relation Age of Onset   Allergic rhinitis Father    Angioedema Neg Hx    Asthma Neg Hx    Atopy Neg Hx    Eczema Neg Hx    Immunodeficiency Neg Hx    Urticaria Neg Hx     Social History:  reports that he has quit smoking. His smokeless tobacco use includes chew and snuff. He reports current alcohol use. He reports that he does not use drugs.  ROS: All other review of systems were reviewed and are negative except what is noted above in HPI  Physical Exam: BP 133/77    Pulse (!) 121    Ht 6\' 6"  (1.981 m)    Wt 247 lb (112 kg)    BMI 28.54 kg/m   Constitutional:  Alert and oriented, No acute distress. HEENT: [name], moist mucus membranes.  Trachea midline, no masses. Cardiovascular: No clubbing, cyanosis, or edema. Respiratory: Normal respiratory effort, no increased work of breathing. GI: Abdomen is soft, nontender, nondistended, no abdominal masses GU: No CVA tenderness. Circumcised phallus. No masses/lesions on penis, testis, scrotum. Level varicocele, non painful. Prostate 30g  very tender Lymph: No cervical or inguinal lymphadenopathy. Skin: No rashes, bruises or suspicious lesions. Neurologic: Grossly  intact, no focal deficits, moving all 4 extremities. Psychiatric: Normal mood and affect.  Laboratory Data: Lab Results  Component Value Date   WBC 5.2 09/21/2017   HGB 16.2 09/21/2017   HCT 46.7 09/21/2017   MCV 87.3 09/21/2017   PLT 238 09/21/2017    Lab Results  Component Value Date   CREATININE 0.82 09/21/2017    No results found for: PSA  No results found for: TESTOSTERONE  No results found for: HGBA1C  Urinalysis    Component Value Date/Time   COLORURINE YELLOW 09/21/2017 1000   APPEARANCEUR CLEAR 09/21/2017 1000   LABSPEC >1.046 (H) 09/21/2017 1000   PHURINE 7.0 09/21/2017 1000   GLUCOSEU >=500 (A) 09/21/2017 1000   HGBUR NEGATIVE 09/21/2017 1000   BILIRUBINUR NEGATIVE 09/21/2017 1000   KETONESUR 80 (A) 09/21/2017 1000   PROTEINUR NEGATIVE 09/21/2017 1000   UROBILINOGEN 0.2 08/30/2014 1414   NITRITE NEGATIVE 09/21/2017 1000   LEUKOCYTESUR NEGATIVE 09/21/2017 1000    Lab Results  Component Value Date   BACTERIA NONE SEEN 09/21/2017    Pertinent Imaging: KUB today Scrotal 11/21/2017 04/08/2021: Images reviewed and discussed with the patient  No results found for this or any previous visit.  No results found for this or any previous visit.  No results found for this or any previous visit.  No results found for this or any previous visit.  No results found for this or any previous visit.  No results found for this or any previous visit.  No results found for this or any previous visit.  Results for orders placed during the hospital encounter of 08/30/14  CT RENAL STONE STUDY  Narrative CLINICAL DATA:  Right flank pain starting today, status post appendectomy, status postcholecystectomy.  EXAM: CT ABDOMEN AND PELVIS WITHOUT CONTRAST  TECHNIQUE: Multidetector CT imaging of the abdomen and pelvis was performed following the standard protocol without IV contrast.  COMPARISON:  None.  FINDINGS: Sagittal images of the spine shows disc space  flattening with vacuum disc phenomenon at L5-S1 level. The lung bases are unremarkable.  The patient is status postcholecystectomy. Unenhanced liver shows no biliary ductal dilatation. Unenhanced pancreas, spleen and adrenal glands are unremarkable. Unenhanced kidneys are symmetrical in size. No aortic aneurysm. No nephrolithiasis. No hydronephrosis or hydroureter. No calcified ureteral calculi are noted.  No small bowel obstruction. No ascites or free air. No adenopathy. Moderate stool and gas noted in transverse colon right colon and descending colon.  There is a redundant descending colon and especially sigmoid colon. The sigmoid colon is looping in right mid abdomen moderate distended with gas. There is some narrowing of the lumen of proximal sigmoid colon in axial image 53 and 54 without definite evidence of colonic obstruction. Please note this circular segment of the colon is surrounding mild rotational pattern of mesenteric vessels as seen in axial  image 54. There is no definite evidence of internal hernia or mesenteric edema or fluid. Clinical correlation is necessary. If the patient has persistent colic like abdominal pain follow-up enhanced study could be performed as clinically warranted.  IMPRESSION: 1. There is no nephrolithiasis.  No hydronephrosis or hydroureter. 2. Status post appendectomy. 3. There is redundant sigmoid colon moderate distended with gas. There is circular short segment mild narrowing of the lumen in proximal sigmoid colon surrounding a rotational pattern of mesenteric vessels please see axial image 54. There is no definite evidence of colonic obstruction or mesenteric edema. No definite evidence of colonic volvulus. No definite evidence of internal hernia. If the patient has persistent colic like abdominal pain follow-up enhanced study with IV and oral contrast could be performed as clinically warranted. 4. No small bowel obstruction. These  results were called by telephone at the time of interpretation on 08/30/2014 at 4:08 pm to Dr. Vanetta Mulders , who verbally acknowledged these results.   Electronically Signed By: Natasha Mead M.D. On: 08/30/2014 16:08   Assessment & Plan:    1.  Acute prostatitis -urine for culture -rocephin 1g today -bactrim DS BID for 21 days  -flomax 0.4mg  daily   No follow-ups on file.  Wilkie Aye, MD  [facility] [place]

## 2021-04-10 NOTE — Progress Notes (Signed)
Urological Symptom Review  IM rocephin 1gm given with no difficulty.   Patient is experiencing the following symptoms: Frequent urination Hard to postpone urination Get up at night to urinate Leakage of urine Stream starts and stops Have to strain to urinate Painful intercourse Weak stream   Review of Systems  Gastrointestinal (upper)  : Negative for upper GI symptoms  Gastrointestinal (lower) : Negative for lower GI symptoms  Constitutional : Negative for symptoms  Skin: Skin rash/lesion  Eyes: Negative for eye symptoms  Ear/Nose/Throat : Negative for Ear/Nose/Throat symptoms  Hematologic/Lymphatic: Negative for Hematologic/Lymphatic symptoms  Cardiovascular : Negative for cardiovascular symptoms  Respiratory : Negative for respiratory symptoms  Endocrine: Negative for endocrine symptoms  Musculoskeletal: Back pain Joint pain  Neurological: Negative for neurological symptoms  Psychologic: Negative for psychiatric symptoms

## 2021-04-10 NOTE — Patient Instructions (Signed)
Prostatitis Prostatitis is swelling of the prostate gland, also called the prostate. This gland is about 1.5 inches wide and 1 inch high, and it helps to make a fluid called semen. The prostate is below a man's bladder, in front of the butt (rectum). There are different types of prostatitis. What are the causes? One type of prostatitis is caused by an infection from germs (bacteria). Another type is not caused by germs. It may be caused by: Things having to do with the nervous system. This system includes thebrain, spinal cord, and nerves. An autoimmune response. This happens when the body's disease-fighting system attacks healthy tissue in the body by mistake. Psychological factors. These have to do with how the mind works. The causes of other types of prostatitis are normally not known. What are the signs or symptoms? Symptoms of this condition depend on the type of prostatitis you have. If your condition is caused by germs: You may feel pain or burning when you pee (urinate). You may pee often and all of a sudden. You may have problems starting to pee. You may have trouble emptying your bladder when you pee. You may have fever or chills. You may feel pain in your muscles, joints, low back, or lower belly. If you have other types of prostatitis: You may pee often or all of a sudden. You may have trouble starting to pee. You may have a weak flow when you pee. You may leak pee after using the bathroom. You may have other problems, such as: Abnormal fluid coming from the penis. Pain in the testicles or penis. Pain between the butt and the testicles. Pain when fluid comes out of the penis during sex. How is this treated? Treatment for this condition depends on the type of prostatitis. Treatment may include: Medicines. These may treat pain or swelling, or they may help relax muscles. Exercises to help you move better or get stronger (physical therapy). Heat therapy. Techniques to help  you control some of the ways that your body works. Exercises to help you relax. Antibiotic medicine, if your condition is caused by germs. Warm water baths (sitz baths) to relax muscles. Follow these instructions at home: Medicines Take over-the-counter and prescription medicines only as told by your doctor. If you were prescribed an antibiotic medicine, take it as told by your doctor. Do not stop using the antibiotic even if you start to feel better. Managing pain and swelling  Take sitz baths as told by your doctor. For a sitz bath, sit in warm water that is deep enough to cover your hips and butt. If told, put heat on the painful area. Do this as often as told by your doctor. Use the heat source that your doctor recommends, such as a moist heat pack or a heating pad. Place a towel between your skin and the heat source. Leave the heat on for 20-30 minutes. Take off the heat if your skin turns bright red. This is very important if you are unable to feel pain, heat, or cold. You may have a greater risk of getting burned. General instructions Do exercises as told by your doctor, if your doctor prescribed them. Keep all follow-up visits as told by your doctor. This is important. Where to find more information National Institute of Diabetes and Digestive and Kidney Diseases: https://www.niddk.nih.gov Contact a doctor if: Your symptoms get worse. You have a fever. Get help right away if: You have chills. You feel light-headed. You feel like you may faint.   You cannot pee. You have blood or clumps of blood (blood clots) in your pee. Summary Prostatitis is swelling of the prostate gland. There are different types of prostatitis. Treatment depends on the type that you have. Take over-the-counter and prescription medicines only as told by your doctor. Get help right away of you have chills, feel light-headed, or feel like you may faint. Also get help right away if you cannot pee or you have  blood or clumps of blood in your pee. This information is not intended to replace advice given to you by your health care provider. Make sure you discuss any questions you have with your health care provider. Document Revised: 05/04/2019 Document Reviewed: 05/04/2019 Elsevier Patient Education  2022 Elsevier Inc.  

## 2021-04-12 LAB — URINE CULTURE: Organism ID, Bacteria: NO GROWTH

## 2021-04-14 ENCOUNTER — Other Ambulatory Visit: Payer: Self-pay

## 2021-04-14 ENCOUNTER — Inpatient Hospital Stay (HOSPITAL_COMMUNITY)
Admission: EM | Admit: 2021-04-14 | Discharge: 2021-04-17 | DRG: 714 | Disposition: A | Discharge status: Home / Self Care | Payer: Managed Care, Other (non HMO) | Attending: Internal Medicine | Admitting: Internal Medicine

## 2021-04-14 ENCOUNTER — Other Ambulatory Visit: Payer: Self-pay | Admitting: Urology

## 2021-04-14 ENCOUNTER — Telehealth: Payer: Self-pay

## 2021-04-14 ENCOUNTER — Encounter (HOSPITAL_COMMUNITY): Payer: Self-pay

## 2021-04-14 ENCOUNTER — Emergency Department (HOSPITAL_COMMUNITY): Payer: Managed Care, Other (non HMO)

## 2021-04-14 DIAGNOSIS — N400 Enlarged prostate without lower urinary tract symptoms: Secondary | ICD-10-CM | POA: Diagnosis present

## 2021-04-14 DIAGNOSIS — Z20822 Contact with and (suspected) exposure to covid-19: Secondary | ICD-10-CM | POA: Diagnosis present

## 2021-04-14 DIAGNOSIS — K529 Noninfective gastroenteritis and colitis, unspecified: Secondary | ICD-10-CM | POA: Diagnosis present

## 2021-04-14 DIAGNOSIS — J4520 Mild intermittent asthma, uncomplicated: Secondary | ICD-10-CM | POA: Diagnosis present

## 2021-04-14 DIAGNOSIS — Z9049 Acquired absence of other specified parts of digestive tract: Secondary | ICD-10-CM

## 2021-04-14 DIAGNOSIS — N412 Abscess of prostate: Principal | ICD-10-CM | POA: Diagnosis present

## 2021-04-14 DIAGNOSIS — Z79899 Other long term (current) drug therapy: Secondary | ICD-10-CM

## 2021-04-14 DIAGNOSIS — K5900 Constipation, unspecified: Secondary | ICD-10-CM | POA: Diagnosis present

## 2021-04-14 DIAGNOSIS — J45909 Unspecified asthma, uncomplicated: Secondary | ICD-10-CM

## 2021-04-14 DIAGNOSIS — R102 Pelvic and perineal pain: Secondary | ICD-10-CM | POA: Diagnosis present

## 2021-04-14 DIAGNOSIS — Z7984 Long term (current) use of oral hypoglycemic drugs: Secondary | ICD-10-CM

## 2021-04-14 DIAGNOSIS — N410 Acute prostatitis: Secondary | ICD-10-CM

## 2021-04-14 DIAGNOSIS — E1165 Type 2 diabetes mellitus with hyperglycemia: Secondary | ICD-10-CM | POA: Diagnosis present

## 2021-04-14 DIAGNOSIS — Z87891 Personal history of nicotine dependence: Secondary | ICD-10-CM

## 2021-04-14 DIAGNOSIS — I10 Essential (primary) hypertension: Secondary | ICD-10-CM | POA: Diagnosis present

## 2021-04-14 DIAGNOSIS — N419 Inflammatory disease of prostate, unspecified: Secondary | ICD-10-CM

## 2021-04-14 LAB — CBC WITH DIFFERENTIAL/PLATELET
Abs Immature Granulocytes: 0.05 10*3/uL (ref 0.00–0.07)
Basophils Absolute: 0 10*3/uL (ref 0.0–0.1)
Basophils Relative: 0 %
Eosinophils Absolute: 0.5 10*3/uL (ref 0.0–0.5)
Eosinophils Relative: 5 %
HCT: 43.1 % (ref 39.0–52.0)
Hemoglobin: 14.5 g/dL (ref 13.0–17.0)
Immature Granulocytes: 1 %
Lymphocytes Relative: 15 %
Lymphs Abs: 1.5 10*3/uL (ref 0.7–4.0)
MCH: 29.8 pg (ref 26.0–34.0)
MCHC: 33.6 g/dL (ref 30.0–36.0)
MCV: 88.7 fL (ref 80.0–100.0)
Monocytes Absolute: 0.7 10*3/uL (ref 0.1–1.0)
Monocytes Relative: 7 %
Neutro Abs: 7.1 10*3/uL (ref 1.7–7.7)
Neutrophils Relative %: 72 %
Platelets: 324 10*3/uL (ref 150–400)
RBC: 4.86 MIL/uL (ref 4.22–5.81)
RDW: 13 % (ref 11.5–15.5)
WBC: 9.8 10*3/uL (ref 4.0–10.5)
nRBC: 0 % (ref 0.0–0.2)

## 2021-04-14 LAB — BASIC METABOLIC PANEL
Anion gap: 12 (ref 5–15)
BUN: 14 mg/dL (ref 6–20)
CO2: 26 mmol/L (ref 22–32)
Calcium: 9.3 mg/dL (ref 8.9–10.3)
Chloride: 98 mmol/L (ref 98–111)
Creatinine, Ser: 0.79 mg/dL (ref 0.61–1.24)
GFR, Estimated: >60 mL/min (ref >60)
Glucose, Bld: 172 mg/dL — ABNORMAL HIGH (ref 70–99)
Potassium: 4.8 mmol/L (ref 3.5–5.1)
Sodium: 136 mmol/L (ref 135–145)

## 2021-04-14 MED ORDER — IOHEXOL 300 MG/ML  SOLN
100.0000 mL | Freq: Once | INTRAMUSCULAR | Status: AC | PRN
  Administered 2021-04-14: 100 mL via INTRAVENOUS

## 2021-04-14 NOTE — Telephone Encounter (Signed)
Thank you :)

## 2021-04-14 NOTE — Telephone Encounter (Signed)
Patient called and advised hid pain has not subsided any and last night he was running a low grade fever. He advised you had mentioned possible ordering a CT scan and wanted to see if you still wanted to do proceed with one.

## 2021-04-14 NOTE — ED Triage Notes (Signed)
Patient referred to ED from urology for ongoing pain from prostatitis. Has been taking antibiotics with no relief.

## 2021-04-14 NOTE — Telephone Encounter (Signed)
Received message to call patient regarding increase in pain/nausea. Stat CT still pending to rule out Anal/rectal abscess.  Spoke with Dr. Ronne Binning, if patient is experiencing increase in pain/nausea and fever, patient will need to go to ER for evaluation and to have CT completed. Patient voiced understanding of Dr. Ronne Binning recommendations.

## 2021-04-14 NOTE — Telephone Encounter (Signed)
He advised his pain has stayed the same, no relief.

## 2021-04-15 ENCOUNTER — Inpatient Hospital Stay (HOSPITAL_COMMUNITY): Payer: Managed Care, Other (non HMO) | Admitting: Anesthesiology

## 2021-04-15 ENCOUNTER — Encounter (HOSPITAL_COMMUNITY)
Admission: EM | Disposition: A | Discharge status: Home / Self Care | Payer: Self-pay | Source: Home / Self Care | Attending: Internal Medicine

## 2021-04-15 ENCOUNTER — Inpatient Hospital Stay: Admit: 2021-04-15 | Payer: Managed Care, Other (non HMO) | Admitting: Urology

## 2021-04-15 ENCOUNTER — Encounter (HOSPITAL_COMMUNITY): Payer: Self-pay | Admitting: Internal Medicine

## 2021-04-15 DIAGNOSIS — Z87891 Personal history of nicotine dependence: Secondary | ICD-10-CM | POA: Diagnosis not present

## 2021-04-15 DIAGNOSIS — N400 Enlarged prostate without lower urinary tract symptoms: Secondary | ICD-10-CM | POA: Diagnosis present

## 2021-04-15 DIAGNOSIS — E1165 Type 2 diabetes mellitus with hyperglycemia: Secondary | ICD-10-CM

## 2021-04-15 DIAGNOSIS — K529 Noninfective gastroenteritis and colitis, unspecified: Secondary | ICD-10-CM | POA: Diagnosis present

## 2021-04-15 DIAGNOSIS — J4520 Mild intermittent asthma, uncomplicated: Secondary | ICD-10-CM | POA: Diagnosis present

## 2021-04-15 DIAGNOSIS — K5900 Constipation, unspecified: Secondary | ICD-10-CM | POA: Diagnosis present

## 2021-04-15 DIAGNOSIS — J45909 Unspecified asthma, uncomplicated: Secondary | ICD-10-CM

## 2021-04-15 DIAGNOSIS — I10 Essential (primary) hypertension: Secondary | ICD-10-CM | POA: Diagnosis present

## 2021-04-15 DIAGNOSIS — R102 Pelvic and perineal pain: Secondary | ICD-10-CM | POA: Diagnosis present

## 2021-04-15 DIAGNOSIS — Z9049 Acquired absence of other specified parts of digestive tract: Secondary | ICD-10-CM | POA: Diagnosis not present

## 2021-04-15 DIAGNOSIS — N412 Abscess of prostate: Secondary | ICD-10-CM | POA: Diagnosis present

## 2021-04-15 DIAGNOSIS — Z20822 Contact with and (suspected) exposure to covid-19: Secondary | ICD-10-CM | POA: Diagnosis present

## 2021-04-15 DIAGNOSIS — Z7984 Long term (current) use of oral hypoglycemic drugs: Secondary | ICD-10-CM | POA: Diagnosis not present

## 2021-04-15 DIAGNOSIS — Z79899 Other long term (current) drug therapy: Secondary | ICD-10-CM | POA: Diagnosis not present

## 2021-04-15 HISTORY — PX: TRANSURETHRAL RESECTION OF PROSTATE: SHX73

## 2021-04-15 HISTORY — PX: CYSTOSCOPY: SHX5120

## 2021-04-15 LAB — RESP PANEL BY RT-PCR (FLU A&B, COVID) ARPGX2
Influenza A by PCR: NEGATIVE
Influenza B by PCR: NEGATIVE
SARS Coronavirus 2 by RT PCR: NEGATIVE

## 2021-04-15 LAB — CBC
HCT: 39.9 % (ref 39.0–52.0)
Hemoglobin: 13.7 g/dL (ref 13.0–17.0)
MCH: 30.1 pg (ref 26.0–34.0)
MCHC: 34.3 g/dL (ref 30.0–36.0)
MCV: 87.7 fL (ref 80.0–100.0)
Platelets: 346 10*3/uL (ref 150–400)
RBC: 4.55 MIL/uL (ref 4.22–5.81)
RDW: 13 % (ref 11.5–15.5)
WBC: 10 10*3/uL (ref 4.0–10.5)
nRBC: 0 % (ref 0.0–0.2)

## 2021-04-15 LAB — URINALYSIS, ROUTINE W REFLEX MICROSCOPIC
Bacteria, UA: NONE SEEN
Bilirubin Urine: NEGATIVE
Glucose, UA: >=500 mg/dL — AB
Ketones, ur: 80 mg/dL — AB
Nitrite: NEGATIVE
Protein, ur: 30 mg/dL — AB
Specific Gravity, Urine: >1.046 — ABNORMAL HIGH (ref 1.005–1.030)
pH: 5 (ref 5.0–8.0)

## 2021-04-15 LAB — COMPREHENSIVE METABOLIC PANEL
ALT: 13 U/L (ref 0–44)
AST: 14 U/L — ABNORMAL LOW (ref 15–41)
Albumin: 3.4 g/dL — ABNORMAL LOW (ref 3.5–5.0)
Alkaline Phosphatase: 75 U/L (ref 38–126)
Anion gap: 11 (ref 5–15)
BUN: 14 mg/dL (ref 6–20)
CO2: 23 mmol/L (ref 22–32)
Calcium: 8.4 mg/dL — ABNORMAL LOW (ref 8.9–10.3)
Chloride: 98 mmol/L (ref 98–111)
Creatinine, Ser: 0.67 mg/dL (ref 0.61–1.24)
GFR, Estimated: >60 mL/min (ref >60)
Glucose, Bld: 150 mg/dL — ABNORMAL HIGH (ref 70–99)
Potassium: 3.7 mmol/L (ref 3.5–5.1)
Sodium: 132 mmol/L — ABNORMAL LOW (ref 135–145)
Total Bilirubin: 0.6 mg/dL (ref 0.3–1.2)
Total Protein: 7.3 g/dL (ref 6.5–8.1)

## 2021-04-15 LAB — CBG MONITORING, ED
Glucose-Capillary: 118 mg/dL — ABNORMAL HIGH (ref 70–99)
Glucose-Capillary: 115 mg/dL — ABNORMAL HIGH (ref 70–99)

## 2021-04-15 LAB — HEMOGLOBIN A1C
Hgb A1c MFr Bld: 10.4 % — ABNORMAL HIGH (ref 4.8–5.6)
Mean Plasma Glucose: 251.78 mg/dL

## 2021-04-15 LAB — MAGNESIUM: Magnesium: 2.1 mg/dL (ref 1.7–2.4)

## 2021-04-15 LAB — SURGICAL PCR SCREEN
MRSA, PCR: NEGATIVE
Staphylococcus aureus: POSITIVE — AB

## 2021-04-15 LAB — HIV ANTIBODY (ROUTINE TESTING W REFLEX): HIV Screen 4th Generation wRfx: NONREACTIVE

## 2021-04-15 LAB — GLUCOSE, CAPILLARY
Glucose-Capillary: 209 mg/dL — ABNORMAL HIGH (ref 70–99)
Glucose-Capillary: 255 mg/dL — ABNORMAL HIGH (ref 70–99)
Glucose-Capillary: 93 mg/dL (ref 70–99)

## 2021-04-15 LAB — PHOSPHORUS: Phosphorus: 3.4 mg/dL (ref 2.5–4.6)

## 2021-04-15 SURGERY — TURBT (TRANSURETHRAL RESECTION OF BLADDER TUMOR)
Anesthesia: General

## 2021-04-15 SURGERY — CYSTOSCOPY
Anesthesia: General | Site: Urethra

## 2021-04-15 MED ORDER — BELLADONNA ALKALOIDS-OPIUM 16.2-60 MG RE SUPP
RECTAL | Status: AC
  Filled 2021-04-15: qty 1

## 2021-04-15 MED ORDER — ONDANSETRON HCL 4 MG/2ML IJ SOLN: 4.0000 mg | Freq: Once | INTRAMUSCULAR | Status: DC | PRN

## 2021-04-15 MED ORDER — MIDAZOLAM HCL 2 MG/2ML IJ SOLN
INTRAMUSCULAR | Status: AC
  Filled 2021-04-15: qty 2

## 2021-04-15 MED ORDER — ALBUTEROL SULFATE HFA 108 (90 BASE) MCG/ACT IN AERS: 2.0000 | INHALATION_SPRAY | Freq: Four times a day (QID) | RESPIRATORY_TRACT | Status: DC | PRN

## 2021-04-15 MED ORDER — METRONIDAZOLE 500 MG/100ML IV SOLN
INTRAVENOUS | Status: AC
  Filled 2021-04-15: qty 100

## 2021-04-15 MED ORDER — MIDAZOLAM HCL 5 MG/5ML IJ SOLN
INTRAMUSCULAR | Status: DC | PRN
  Administered 2021-04-15: 2 mg via INTRAVENOUS

## 2021-04-15 MED ORDER — FENTANYL CITRATE (PF) 100 MCG/2ML IJ SOLN
INTRAMUSCULAR | Status: AC
  Filled 2021-04-15: qty 2

## 2021-04-15 MED ORDER — METRONIDAZOLE 500 MG/100ML IV SOLN
500.0000 mg | Freq: Two times a day (BID) | INTRAVENOUS | Status: DC
  Administered 2021-04-15 – 2021-04-17 (×4): 500 mg via INTRAVENOUS
  Filled 2021-04-15 (×5): qty 100

## 2021-04-15 MED ORDER — MORPHINE SULFATE (PF) 4 MG/ML IV SOLN
4.0000 mg | Freq: Once | INTRAVENOUS | Status: AC
  Administered 2021-04-15: 4 mg via INTRAVENOUS

## 2021-04-15 MED ORDER — ONDANSETRON HCL 4 MG/2ML IJ SOLN
INTRAMUSCULAR | Status: AC
  Filled 2021-04-15: qty 2

## 2021-04-15 MED ORDER — LACTATED RINGERS IV SOLN: INTRAVENOUS | Status: DC

## 2021-04-15 MED ORDER — ACETAMINOPHEN 650 MG RE SUPP: 650.0000 mg | Freq: Four times a day (QID) | RECTAL | Status: DC | PRN

## 2021-04-15 MED ORDER — HYDROMORPHONE HCL 1 MG/ML IJ SOLN
0.5000 mg | INTRAMUSCULAR | Status: DC | PRN
  Administered 2021-04-15: 0.5 mg via INTRAVENOUS
  Administered 2021-04-15: 1 mg via INTRAVENOUS
  Filled 2021-04-15 (×2): qty 0.5

## 2021-04-15 MED ORDER — DEXAMETHASONE SODIUM PHOSPHATE 10 MG/ML IJ SOLN
INTRAMUSCULAR | Status: DC | PRN
  Administered 2021-04-15: 4 mg via INTRAVENOUS

## 2021-04-15 MED ORDER — DEXMEDETOMIDINE (PRECEDEX) IN NS 20 MCG/5ML (4 MCG/ML) IV SYRINGE
PREFILLED_SYRINGE | INTRAVENOUS | Status: AC
  Filled 2021-04-15: qty 5

## 2021-04-15 MED ORDER — ORAL CARE MOUTH RINSE: 15.0000 mL | Freq: Once | OROMUCOSAL | Status: DC

## 2021-04-15 MED ORDER — LIDOCAINE 2% (20 MG/ML) 5 ML SYRINGE
INTRAMUSCULAR | Status: DC | PRN
  Administered 2021-04-15: 100 mg via INTRAVENOUS

## 2021-04-15 MED ORDER — LACTATED RINGERS IV SOLN
INTRAVENOUS | Status: DC | PRN
Start: 2021-04-15 — End: 2021-04-15

## 2021-04-15 MED ORDER — CHLORHEXIDINE GLUCONATE 0.12 % MT SOLN: 15.0000 mL | Freq: Once | OROMUCOSAL | Status: DC

## 2021-04-15 MED ORDER — MONTELUKAST SODIUM 10 MG PO TABS
10.0000 mg | ORAL_TABLET | Freq: Every day | ORAL | Status: DC
  Administered 2021-04-15 – 2021-04-16 (×2): 10 mg via ORAL
  Filled 2021-04-15 (×2): qty 1

## 2021-04-15 MED ORDER — BUDESONIDE 0.25 MG/2ML IN SUSP
2.0000 mL | Freq: Two times a day (BID) | RESPIRATORY_TRACT | Status: DC
  Filled 2021-04-15: qty 2

## 2021-04-15 MED ORDER — METRONIDAZOLE 500 MG/100ML IV SOLN
500.0000 mg | Freq: Once | INTRAVENOUS | Status: AC
  Administered 2021-04-15: 500 mg via INTRAVENOUS

## 2021-04-15 MED ORDER — LIDOCAINE HCL (PF) 2 % IJ SOLN
INTRAMUSCULAR | Status: AC
  Filled 2021-04-15: qty 10

## 2021-04-15 MED ORDER — FENTANYL CITRATE (PF) 100 MCG/2ML IJ SOLN
INTRAMUSCULAR | Status: DC | PRN
Start: 2021-04-15 — End: 2021-04-15
  Administered 2021-04-15 (×2): 25 ug via INTRAVENOUS
  Administered 2021-04-15 (×3): 50 ug via INTRAVENOUS

## 2021-04-15 MED ORDER — FENTANYL CITRATE PF 50 MCG/ML IJ SOSY: 25.0000 ug | PREFILLED_SYRINGE | INTRAMUSCULAR | Status: DC | PRN

## 2021-04-15 MED ORDER — OXYCODONE HCL 5 MG PO TABS
5.0000 mg | ORAL_TABLET | ORAL | Status: DC | PRN
  Administered 2021-04-15 – 2021-04-16 (×8): 5 mg via ORAL
  Filled 2021-04-15 (×8): qty 1

## 2021-04-15 MED ORDER — ONDANSETRON HCL 4 MG/2ML IJ SOLN
INTRAMUSCULAR | Status: DC | PRN
  Administered 2021-04-15: 4 mg via INTRAVENOUS

## 2021-04-15 MED ORDER — ACETAMINOPHEN 325 MG PO TABS: 650.0000 mg | ORAL_TABLET | Freq: Four times a day (QID) | ORAL | Status: DC | PRN

## 2021-04-15 MED ORDER — MORPHINE SULFATE (PF) 4 MG/ML IV SOLN
INTRAVENOUS | Status: AC
  Filled 2021-04-15: qty 1

## 2021-04-15 MED ORDER — GLYCOPYRROLATE PF 0.2 MG/ML IJ SOSY
PREFILLED_SYRINGE | INTRAMUSCULAR | Status: AC
  Filled 2021-04-15: qty 1

## 2021-04-15 MED ORDER — STERILE WATER FOR IRRIGATION IR SOLN
Status: DC | PRN
  Administered 2021-04-15: 1000 mL

## 2021-04-15 MED ORDER — CHLORHEXIDINE GLUCONATE CLOTH 2 % EX PADS
6.0000 | MEDICATED_PAD | Freq: Every day | CUTANEOUS | Status: DC
  Administered 2021-04-16 – 2021-04-17 (×2): 6 via TOPICAL

## 2021-04-15 MED ORDER — TAMSULOSIN HCL 0.4 MG PO CAPS
0.4000 mg | ORAL_CAPSULE | Freq: Every day | ORAL | Status: DC
  Administered 2021-04-15 – 2021-04-16 (×2): 0.4 mg via ORAL
  Filled 2021-04-15 (×2): qty 1

## 2021-04-15 MED ORDER — CIPROFLOXACIN IN D5W 400 MG/200ML IV SOLN
400.0000 mg | Freq: Two times a day (BID) | INTRAVENOUS | Status: DC
  Administered 2021-04-15: 200 mg via INTRAVENOUS
  Administered 2021-04-15 – 2021-04-17 (×4): 400 mg via INTRAVENOUS
  Filled 2021-04-15 (×5): qty 200

## 2021-04-15 MED ORDER — HYDROMORPHONE HCL 1 MG/ML IJ SOLN
INTRAMUSCULAR | Status: AC
  Filled 2021-04-15: qty 0.5

## 2021-04-15 MED ORDER — PROPOFOL 10 MG/ML IV BOLUS
INTRAVENOUS | Status: AC
  Filled 2021-04-15: qty 20

## 2021-04-15 MED ORDER — SODIUM CHLORIDE 0.9 % IR SOLN
Status: DC | PRN
  Administered 2021-04-15 (×4): 3000 mL

## 2021-04-15 MED ORDER — INSULIN ASPART 100 UNIT/ML IJ SOLN
0.0000 [IU] | INTRAMUSCULAR | Status: DC
  Administered 2021-04-15 – 2021-04-16 (×2): 8 [IU] via SUBCUTANEOUS
  Administered 2021-04-16: 2 [IU] via SUBCUTANEOUS
  Administered 2021-04-16 (×2): 3 [IU] via SUBCUTANEOUS
  Administered 2021-04-16 – 2021-04-17 (×4): 5 [IU] via SUBCUTANEOUS
  Administered 2021-04-17 (×2): 3 [IU] via SUBCUTANEOUS

## 2021-04-15 MED ORDER — DEXAMETHASONE SODIUM PHOSPHATE 10 MG/ML IJ SOLN
INTRAMUSCULAR | Status: AC
  Filled 2021-04-15: qty 1

## 2021-04-15 MED ORDER — CIPROFLOXACIN IN D5W 400 MG/200ML IV SOLN
INTRAVENOUS | Status: AC
  Filled 2021-04-15: qty 200

## 2021-04-15 MED ORDER — DEXMEDETOMIDINE (PRECEDEX) IN NS 20 MCG/5ML (4 MCG/ML) IV SYRINGE
PREFILLED_SYRINGE | INTRAVENOUS | Status: DC | PRN
  Administered 2021-04-15: 12 ug via INTRAVENOUS
  Administered 2021-04-15: 8 ug via INTRAVENOUS

## 2021-04-15 MED ORDER — BELLADONNA ALKALOIDS-OPIUM 16.2-60 MG RE SUPP
1.0000 | Freq: Once | RECTAL | Status: AC
  Administered 2021-04-15: 1 via RECTAL

## 2021-04-15 MED ORDER — PROPOFOL 10 MG/ML IV BOLUS
INTRAVENOUS | Status: DC | PRN
  Administered 2021-04-15: 200 mg via INTRAVENOUS
  Administered 2021-04-15: 50 mg via INTRAVENOUS

## 2021-04-15 MED ORDER — CIPROFLOXACIN IN D5W 400 MG/200ML IV SOLN
400.0000 mg | Freq: Once | INTRAVENOUS | Status: AC
  Administered 2021-04-15: 400 mg via INTRAVENOUS

## 2021-04-15 SURGICAL SUPPLY — 23 items
BAG DRAIN URO TABLE W/ADPT NS (BAG) ×3 IMPLANT
BAG DRN 8 ADPR NS SKTRN CSTL (BAG) ×2
BAG DRN URN TUBE DRIP CHMBR (OSTOMY) ×2
BAG HAMPER (MISCELLANEOUS) ×3 IMPLANT
BAG URINE DRAIN TURP 4L (OSTOMY) ×3 IMPLANT
CATH FOLEY 3WAY 30CC 22F (CATHETERS) ×3 IMPLANT
CLOTH BEACON ORANGE TIMEOUT ST (SAFETY) ×3 IMPLANT
GLOVE SURG POLYISO LF SZ8 (GLOVE) ×3 IMPLANT
GLOVE SURG UNDER POLY LF SZ7 (GLOVE) ×6 IMPLANT
GOWN STRL REUS W/TWL LRG LVL3 (GOWN DISPOSABLE) ×6 IMPLANT
GOWN STRL REUS W/TWL XL LVL3 (GOWN DISPOSABLE) ×3 IMPLANT
IV NS IRRIG 3000ML ARTHROMATIC (IV SOLUTION) ×14 IMPLANT
KIT TURNOVER CYSTO (KITS) ×3 IMPLANT
LOOP CUT BIPOLAR 24F LRG (ELECTROSURGICAL) ×3 IMPLANT
MANIFOLD NEPTUNE II (INSTRUMENTS) ×3 IMPLANT
PACK CYSTO (CUSTOM PROCEDURE TRAY) ×3 IMPLANT
PAD ARMBOARD 7.5X6 YLW CONV (MISCELLANEOUS) ×3 IMPLANT
PLUG CATH AND CAP STER (CATHETERS) ×1 IMPLANT
SYR TOOMEY IRRIG 70ML (MISCELLANEOUS) ×3
SYRINGE TOOMEY IRRIG 70ML (MISCELLANEOUS) ×2 IMPLANT
TOWEL NATURAL 4PK STERILE (DISPOSABLE) ×3 IMPLANT
TOWEL OR 17X26 4PK STRL BLUE (TOWEL DISPOSABLE) ×3 IMPLANT
WATER STERILE IRR 500ML POUR (IV SOLUTION) ×3 IMPLANT

## 2021-04-15 NOTE — H&P (Signed)
History and Physical  Raymond CAMPESE [id number] DOB: Jan 03, 1976 DOA: 04/14/2021  Referring physician: Delora Fuel, MD PCP: Lavella Lemons, PA  Patient coming from: Home  Chief Complaint: Groin pain  HPI: Raymond Sanchez is a 46 y.o. male with medical history significant for hypertension, T2DM, asthma who presents to the emergency department due to 10 day onset of groin pain which was rated as 8-9/10, he also complained of pressure on urination with decreased urine flow.  He followed up with his PCP who ordered testicular ultrasound due to concern of possible kidney stone.  Patient follows with a urologist about 5 days ago and he was diagnosed to have prostatitis, IV ceftriaxone was given, Bactrim was prescribed, he was told to follow-up if symptoms do not improve within 2 to 3 days, since there was no improvement in symptom, ED contacted his urologist we will have him to go to the ED to get a CT scan done.  He denies chest pain, shortness of breath, fever, nausea, vomiting or abdominal pain.  ED Course:  In the emergency department, he was hemodynamically stable.  Work-up in the ED showed normal CBC and BMP except for hyperglycemia.   CT abdomen and pelvis with contrast showed marked severity prostatitis with multiple prostatic abscesses.  Findings which may represent adjacent rectosigmoid colitis.  Plan patient was started on IV ciprofloxacin and metronidazole. Urologist on-call (Dr. Tresa Moore) was consulted and recommended transferring patient to [facility] for surgical management per ED physician.  Hospitalist was asked to admit patient for further evaluation and management.  Review of Systems: A full 10 point Review of Systems was done, except as stated above, all other Review of systems were negative.  Past Medical History:  Diagnosis Date   Diabetes mellitus without complication ([place])    Flexural atopic dermatitis 08/05/2019   Hypertension    Mild intermittent asthma,  uncomplicated [id number]   Past Surgical History:  Procedure Laterality Date   APPENDECTOMY     ARTHROSCOPIC REPAIR ACL Right 2001   CHOLECYSTECTOMY     FOOT SURGERY     KNEE ARTHROSCOPY      Social History:  reports that he has quit smoking. His smokeless tobacco use includes chew and snuff. He reports current alcohol use. He reports that he does not use drugs.   No Known Allergies  Family History  Problem Relation Age of Onset   Allergic rhinitis Father    Angioedema Neg Hx    Asthma Neg Hx    Atopy Neg Hx    Eczema Neg Hx    Immunodeficiency Neg Hx    Urticaria Neg Hx      Prior to Admission medications   Medication Sig Start Date End Date Taking? Authorizing Provider  albuterol (VENTOLIN HFA) 108 (90 Base) MCG/ACT inhaler SMARTSIG:2 Puff(s) By Mouth Every 4-6 Hours 03/28/21   [provider]  beclomethasone (QVAR REDIHALER) 80 MCG/ACT inhaler Inhale 2 puffs into the lungs 2 (two) times daily. 08/03/19   Valentina Shaggy, MD  empagliflozin (JARDIANCE) 25 MG TABS tablet Take 25 mg by mouth daily.    [provider]  levocetirizine (XYZAL) 5 MG tablet Take 1 tablet (5 mg total) by mouth every evening. 08/03/19   Valentina Shaggy, MD  levofloxacin (LEVAQUIN) 500 MG tablet Take 500 mg by mouth daily. 04/06/21   [provider]  montelukast (SINGULAIR) 10 MG tablet Take 1 tablet (10 mg total) by mouth at bedtime. 08/03/19   Salvatore Marvel  Louis, MD  sulfamethoxazole-trimethoprim (BACTRIM DS) 800-160 MG tablet Take 1 tablet by mouth 2 (two) times daily. 04/10/21   McKenzie, Candee Furbish, MD  tamsulosin (FLOMAX) 0.4 MG CAPS capsule Take 1 capsule (0.4 mg total) by mouth daily after supper. 04/10/21   McKenzie, Candee Furbish, MD  traMADol (ULTRAM) 50 MG tablet Take 50-100 mg by mouth 3 (three) times daily. 04/08/21   [provider]  Triamcinolone Acetonide (TRIAMCINOLONE 0.1 % CREAM : EUCERIN) CREA Apply 1 application topically 2 (two) times  daily. 08/03/19   Valentina Shaggy, MD  triamcinolone ointment (KENALOG) 0.1 % Apply 1 application topically 2 (two) times daily. 08/03/19   Valentina Shaggy, MD    Physical Exam: BP 133/81    Pulse 72    Temp 98.1 F (36.7 C) (Oral)    Resp 18    Ht 6\' 6"  (1.981 m)    Wt 112 kg    SpO2 98%    BMI 28.54 kg/m   General: 46 y.o. year-old male well developed well nourished in no acute distress.  Alert and oriented x3. HEENT: NCAT, EOMI Neck: Supple, trachea medial Cardiovascular: Regular rate and rhythm with no rubs or gallops.  No thyromegaly or JVD noted.  No lower extremity edema. 2/4 pulses in all 4 extremities. Respiratory: Clear to auscultation with no wheezes or rales. Good inspiratory effort. Abdomen: Soft, nontender nondistended with normal bowel sounds x4 quadrants. Muskuloskeletal: No cyanosis, clubbing or edema noted bilaterally Neuro: CN II-XII intact, strength 5/5 x 4, sensation, reflexes intact Skin: No ulcerative lesions noted or rashes Psychiatry: Judgement and insight appear normal. Mood is appropriate for condition and setting          Labs on Admission:  Basic Metabolic Panel: Recent Labs  Lab 04/14/21 1823  NA 136  K 4.8  CL 98  CO2 26  GLUCOSE 172*  BUN 14  CREATININE 0.79  CALCIUM 9.3   Liver Function Tests: No results for input(s): AST, ALT, ALKPHOS, BILITOT, PROT, ALBUMIN in the last 168 hours. No results for input(s): LIPASE, AMYLASE in the last 168 hours. No results for input(s): AMMONIA in the last 168 hours. CBC: Recent Labs  Lab 04/14/21 1823  WBC 9.8  NEUTROABS 7.1  HGB 14.5  HCT 43.1  MCV 88.7  PLT 324   Cardiac Enzymes: No results for input(s): CKTOTAL, CKMB, CKMBINDEX, TROPONINI in the last 168 hours.  BNP (last 3 results) No results for input(s): BNP in the last 8760 hours.  ProBNP (last 3 results) No results for input(s): PROBNP in the last 8760 hours.  CBG: No results for input(s): GLUCAP in the last 168  hours.  Radiological Exams on Admission: CT ABDOMEN PELVIS W CONTRAST  Result Date: 04/14/2021 CLINICAL DATA:  Worsening groin pain. EXAM: CT ABDOMEN AND PELVIS WITH CONTRAST TECHNIQUE: Multidetector CT imaging of the abdomen and pelvis was performed using the standard protocol following bolus administration of intravenous contrast. CONTRAST:  147mL OMNIPAQUE IOHEXOL 300 MG/ML  SOLN COMPARISON:  September 21, 2017 FINDINGS: Lower chest: Very mild posterior right basilar atelectasis and/or early infiltrate is seen. Hepatobiliary: No focal liver abnormality is seen. Status post cholecystectomy. No biliary dilatation. Pancreas: Unremarkable. No pancreatic ductal dilatation or surrounding inflammatory changes. Spleen: Normal in size without focal abnormality. Adrenals/Urinary Tract: Adrenal glands are unremarkable. Kidneys are normal, without renal calculi, focal lesion, or hydronephrosis. Bladder is unremarkable. Stomach/Bowel: Stomach is within normal limits. The appendix is surgically absent. No evidence of bowel dilatation. Slightly asymmetric thickening  of the rectosigmoid junction is noted (axial CT images 74 through 79, CT series 2). A mild amount of surrounding inflammatory fat stranding is noted. Vascular/Lymphatic: No significant vascular findings are present. No enlarged abdominal or pelvic lymph nodes. Reproductive: The prostate gland is enlarged (approximately 7.6 cm x 4.0 cm x 3.2 cm) and contains multiple abscesses. The largest measures approximately 4.9 cm x 3.6 cm x 3.8 cm. A moderate amount of surrounding inflammatory fat stranding is also seen. Other: No abdominal wall hernia or abnormality. No abdominopelvic ascites. Musculoskeletal: Degenerative changes seen at the level of L5-S1. IMPRESSION: 1. Marked severity prostatitis with multiple prostatic abscesses. 2. Findings which may represent adjacent rectal sigmoid colitis. 3. Very mild posterior right basilar atelectasis and/or early infiltrate. 4.  Degenerative changes at the level of L5-S1. Electronically Signed   By: Virgina Norfolk M.D.   On: 04/14/2021 20:16    EKG: I independently viewed the EKG done and my findings are as followed: EKG was not done in the ED  Assessment/Plan Present on Admission:  Prostatic abscess  Principal Problem:   Prostatic abscess Active Problems:   Colitis   Asthma   Hyperglycemia due to diabetes mellitus ([place])  Prostatitis and prostatic abscess CT abdomen and pelvis with contrast showed marked severity prostatitis with multiple prostatic abscesses Patient was started on ciprofloxacin and metronidazole, we shall continue with same at this time Continue on Tylenol as needed for fever/mild pain Continue oxycodone as needed for moderate/severe pain Continue Flomax Patient will be transferred to urology at this [facility] was already consulted and recommended admitting patient to [facility] for surgical management.  Sigmoid colitis Continue metronidazole  Hyperglycemia secondary to T2DM Continue ISS and hypoglycemic protocol  Asthma Continue Ventolin, Qvar, Singulair    DVT prophylaxis: SCDs  Code Status: Full code  Family Communication: None at bedside  Disposition Plan:  Patient is from:                        home Anticipated DC to:                   SNF or family members home Anticipated DC date:               2-3 days Anticipated DC barriers:          Patient requires inpatient management due to prostatic abscess requiring surgical management and pending urology consult   Consults called: Urology  Admission status: Inpatient    Bernadette Hoit MD [facility]  04/15/2021, 2:04 AM

## 2021-04-15 NOTE — Addendum Note (Signed)
Addendum  created 04/15/21 1604 by Louann Sjogren, MD   Order list changed

## 2021-04-15 NOTE — ED Provider Notes (Signed)
[facility] EMERGENCY DEPARTMENT Provider Note   CSN: [id number] Arrival date & time: 04/14/21  1703     History  Chief Complaint  Patient presents with   Groin Pain    Raymond Sanchez is a 46 y.o. male.  The history is provided by the patient.  Groin Pain He has history of hypertension, diabetes and comes in with groin pain which has been present for approximately the last 10 days.  He also noted some urinary pressure with decreased force of stream.  He saw his primary care provider who was concerned about possible kidney stone and ordered testicular ultrasound.  At this point, he saw a urologist who diagnosed prostatitis and was given an injection of ceftriaxone.  This was 4 days ago.  He was told to expect some improvement within 2 days.  Symptoms have not improved.  He has not had any fever but has had some chills and sweats.  He denies nausea or vomiting.  He has had some diarrhea which she relates to the antibiotic administration.  He currently rates his pain at 8/10 with occasional worsening to 9/10.   Home Medications Prior to Admission medications   Medication Sig Start Date End Date Taking? Authorizing Provider  albuterol (VENTOLIN HFA) 108 (90 Base) MCG/ACT inhaler SMARTSIG:2 Puff(s) By Mouth Every 4-6 Hours 03/28/21   [provider]  beclomethasone (QVAR REDIHALER) 80 MCG/ACT inhaler Inhale 2 puffs into the lungs 2 (two) times daily. 08/03/19   Valentina Shaggy, MD  empagliflozin (JARDIANCE) 25 MG TABS tablet Take 25 mg by mouth daily.    [provider]  levocetirizine (XYZAL) 5 MG tablet Take 1 tablet (5 mg total) by mouth every evening. 08/03/19   Valentina Shaggy, MD  levofloxacin (LEVAQUIN) 500 MG tablet Take 500 mg by mouth daily. 04/06/21   [provider]  montelukast (SINGULAIR) 10 MG tablet Take 1 tablet (10 mg total) by mouth at bedtime. 08/03/19   Valentina Shaggy, MD  sulfamethoxazole-trimethoprim (BACTRIM DS) 800-160 MG  tablet Take 1 tablet by mouth 2 (two) times daily. 04/10/21   McKenzie, Candee Furbish, MD  tamsulosin (FLOMAX) 0.4 MG CAPS capsule Take 1 capsule (0.4 mg total) by mouth daily after supper. 04/10/21   McKenzie, Candee Furbish, MD  traMADol (ULTRAM) 50 MG tablet Take 50-100 mg by mouth 3 (three) times daily. 04/08/21   [provider]  Triamcinolone Acetonide (TRIAMCINOLONE 0.1 % CREAM : EUCERIN) CREA Apply 1 application topically 2 (two) times daily. 08/03/19   Valentina Shaggy, MD  triamcinolone ointment (KENALOG) 0.1 % Apply 1 application topically 2 (two) times daily. 08/03/19   Valentina Shaggy, MD      Allergies    Patient has no known allergies.    Review of Systems   Review of Systems  All other systems reviewed and are negative.  Physical Exam Updated Vital Signs BP 131/70    Pulse 90    Temp 98.1 F (36.7 C) (Oral)    Resp 20    Ht 6\' 6"  (1.981 m)    Wt 112 kg    SpO2 98%    BMI 28.54 kg/m  Physical Exam Vitals and nursing note reviewed.  46 year old male, resting comfortably and in no acute distress. Vital signs are normal. Oxygen saturation is 98%, which is normal. Head is normocephalic and atraumatic. PERRLA, EOMI. Oropharynx is clear. Neck is nontender and supple without adenopathy or JVD. Back is nontender and there is no CVA  tenderness. Lungs are clear without rales, wheezes, or rhonchi. Chest is nontender. Heart has regular rate and rhythm without murmur. Abdomen is soft, flat, nontender without masses or hepatosplenomegaly and peristalsis is hypoactive. Extremities have no cyanosis or edema, full range of motion is present. Skin is warm and dry without rash. Neurologic: Mental status is normal, cranial nerves are intact, moves all extremities equally.  ED Results / Procedures / Treatments   Labs (all labs ordered are listed, but only abnormal results are displayed) Labs Reviewed  BASIC METABOLIC PANEL - Abnormal; Notable for the following components:       Result Value   Glucose, Bld 172 (*)    All other components within normal limits  CBC WITH DIFFERENTIAL/PLATELET  URINALYSIS, ROUTINE W REFLEX MICROSCOPIC   Radiology CT ABDOMEN PELVIS W CONTRAST  Result Date: 04/14/2021 CLINICAL DATA:  Worsening groin pain. EXAM: CT ABDOMEN AND PELVIS WITH CONTRAST TECHNIQUE: Multidetector CT imaging of the abdomen and pelvis was performed using the standard protocol following bolus administration of intravenous contrast. CONTRAST:  146mL OMNIPAQUE IOHEXOL 300 MG/ML  SOLN COMPARISON:  September 21, 2017 FINDINGS: Lower chest: Very mild posterior right basilar atelectasis and/or early infiltrate is seen. Hepatobiliary: No focal liver abnormality is seen. Status post cholecystectomy. No biliary dilatation. Pancreas: Unremarkable. No pancreatic ductal dilatation or surrounding inflammatory changes. Spleen: Normal in size without focal abnormality. Adrenals/Urinary Tract: Adrenal glands are unremarkable. Kidneys are normal, without renal calculi, focal lesion, or hydronephrosis. Bladder is unremarkable. Stomach/Bowel: Stomach is within normal limits. The appendix is surgically absent. No evidence of bowel dilatation. Slightly asymmetric thickening of the rectosigmoid junction is noted (axial CT images 74 through 79, CT series 2). A mild amount of surrounding inflammatory fat stranding is noted. Vascular/Lymphatic: No significant vascular findings are present. No enlarged abdominal or pelvic lymph nodes. Reproductive: The prostate gland is enlarged (approximately 7.6 cm x 4.0 cm x 3.2 cm) and contains multiple abscesses. The largest measures approximately 4.9 cm x 3.6 cm x 3.8 cm. A moderate amount of surrounding inflammatory fat stranding is also seen. Other: No abdominal wall hernia or abnormality. No abdominopelvic ascites. Musculoskeletal: Degenerative changes seen at the level of L5-S1. IMPRESSION: 1. Marked severity prostatitis with multiple prostatic abscesses. 2. Findings  which may represent adjacent rectal sigmoid colitis. 3. Very mild posterior right basilar atelectasis and/or early infiltrate. 4. Degenerative changes at the level of L5-S1. Electronically Signed   By: Virgina Norfolk M.D.   On: 04/14/2021 20:16    Procedures Procedures    Medications Ordered in ED Medications  iohexol (OMNIPAQUE) 300 MG/ML solution 100 mL (100 mLs Intravenous Contrast Given 04/14/21 2000)    ED Course/ Medical Decision Making/ A&P                           Medical Decision Making  Prostatitis which has failed to respond to appropriate outpatient management.  Screening labs are ordered and are unremarkable.  WBC is normal and renal function is normal.  CT of abdomen and pelvis was ordered and reviewed by myself showing signs of severe prostatitis with prostatic abscesses and possible adjacent recto-sigmoid colitis.  Because of failure to respond to outpatient management and presence of prostatic abscesses, he will need to be admitted with urology consultation for possible surgical drainage.  He is given dose of ciprofloxacin and metronidazole..  Old records are reviewed showing scrotal ultrasound on 12/28 showing left-sided varicocele and trace bilateral hydroceles, KUB showing no  renal calculi.  Apparently, he was also given prescription for trimethoprim-sulfamethoxazole following his injection of ceftriaxone.  Case is discussed with Dr. Josephine Cables of [facility], who agrees to admit the patient.  I have also discussed the case with Dr. Tresa Moore, on-call for urology.  He has reviewed CT scan and feels patient will need to be transferred to [facility] for surgical management.  He agrees with antibiotics given here.  CRITICAL CARE Performed by: Delora Fuel Total critical care time: 35 minutes Critical care time was exclusive of separately billable procedures and treating other patients. Critical care was necessary to treat or prevent imminent or  life-threatening deterioration. Critical care was time spent personally by me on the following activities: development of treatment plan with patient and/or surrogate as well as nursing, discussions with consultants, evaluation of patient's response to treatment, examination of patient, obtaining history from patient or surrogate, ordering and performing treatments and interventions, ordering and review of laboratory studies, ordering and review of radiographic studies, pulse oximetry and re-evaluation of patient's condition.       Final Clinical Impression(s) / ED Diagnoses Final diagnoses:  Prostatic abscess  Prostatitis, unspecified prostatitis type    Rx / DC Orders ED Discharge Orders     None         Delora Fuel, [place] [id number] [phone]

## 2021-04-15 NOTE — TOC Progression Note (Signed)
Transition of Care [facility]) - Progression Note    Patient Details  Name: Raymond Sanchez MRN: [id number] Date of Birth: [birth year]  Transition of Care [facility]) CM/SW Contact  Salome Arnt,  Phone Number: 04/15/2021, 11:29 AM  Clinical Narrative:   Transition of Care [facility]) Screening Note   Patient Details  Name: Raymond Sanchez Date of Birth: [birth year]   [facility]) CM/SW Contact:    Salome Arnt, [place] Phone Number: 04/15/2021, 11:29 AM    Transition of Care Department [facility]) has reviewed patient and no TOC needs have been identified at this time. We will continue to monitor patient advancement through interdisciplinary progression rounds. If new patient transition needs arise, please place a TOC consult.         Barriers to Discharge: Continued Medical Work up  Expected Discharge Plan and Services                                                 Social Determinants of Health (SDOH) Interventions    Readmission Risk Interventions No flowsheet data found.

## 2021-04-15 NOTE — Consult Note (Incomplete)
Reason for Consult:Large Prostate Abscesses  Referring Physician: Delora Fuel MD  Raymond Sanchez is an 46 y.o. male.   HPI:   1 - Large Prostate Abscesses - very large multifocal prostate abscesses by Er CT 04/15/2020 on eval refractory prostatitis. [place] 12/30 negative and has been on bactrim after office visit with Raymond Sanchez in [place].  Diabetic on jardiance (know to exacerbate severe GU infrections). No retention. UCX, BCX 1/4 pending, palced on empiric cipro/flagyl.   PMH sig for DM2 (jardiance, A1c XXX), XXXX  Today "Ray" is seen in consultation for above. Last meal XXX. C19 screen XXX.   Past Medical History:  Diagnosis Date   Diabetes mellitus without complication ([place])    Flexural atopic dermatitis 08/05/2019   Hypertension    Mild intermittent asthma, uncomplicated [id number]    Past Surgical History:  Procedure Laterality Date   APPENDECTOMY     ARTHROSCOPIC REPAIR ACL Right 2001   CHOLECYSTECTOMY     FOOT SURGERY     KNEE ARTHROSCOPY      Family History  Problem Relation Age of Onset   Allergic rhinitis Father    Angioedema Neg Hx    Asthma Neg Hx    Atopy Neg Hx    Eczema Neg Hx    Immunodeficiency Neg Hx    Urticaria Neg Hx     Social History:  reports that he has quit smoking. His smokeless tobacco use includes chew and snuff. He reports current alcohol use. He reports that he does not use drugs.  Allergies: No Known Allergies  Medications: {medication reviewed/display:3041432}  Results for orders placed or performed during the hospital encounter of 04/14/21 (from the past 48 hour(s))  CBC with Differential     Status: None   Collection Time: 04/14/21  6:23 PM  Result Value Ref Range   WBC 9.8 4.0 - 10.5 K/uL   RBC 4.86 4.22 - 5.81 MIL/uL   Hemoglobin 14.5 13.0 - 17.0 g/dL   HCT 43.1 39.0 - 52.0 %   MCV 88.7 80.0 - 100.0 fL   MCH 29.8 26.0 - 34.0 pg   MCHC 33.6 30.0 - 36.0 g/dL   RDW 13.0 11.5 - 15.5 %   Platelets 324 150 - 400 K/uL   nRBC 0.0  0.0 - 0.2 %   Neutrophils Relative % 72 %   Neutro Abs 7.1 1.7 - 7.7 K/uL   Lymphocytes Relative 15 %   Lymphs Abs 1.5 0.7 - 4.0 K/uL   Monocytes Relative 7 %   Monocytes Absolute 0.7 0.1 - 1.0 K/uL   Eosinophils Relative 5 %   Eosinophils Absolute 0.5 0.0 - 0.5 K/uL   Basophils Relative 0 %   Basophils Absolute 0.0 0.0 - 0.1 K/uL   Immature Granulocytes 1 %   Abs Immature Granulocytes 0.05 0.00 - 0.07 K/uL    Comment: Performed at [facility], [address], [place], [place] [id number]  Basic metabolic panel     Status: Abnormal   Collection Time: 04/14/21  6:23 PM  Result Value Ref Range   Sodium 136 135 - 145 mmol/L   Potassium 4.8 3.5 - 5.1 mmol/L   Chloride 98 98 - 111 mmol/L   CO2 26 22 - 32 mmol/L   Glucose, Bld 172 (H) 70 - 99 mg/dL    Comment: Glucose reference range applies only to samples taken after fasting for at least 8 hours.   BUN 14 6 - 20 mg/dL   Creatinine, Ser 0.79 0.61 - 1.24  mg/dL   Calcium 9.3 8.9 - 10.3 mg/dL   GFR, Estimated >60 >60 mL/min    Comment: (NOTE) Calculated using the CKD-EPI Creatinine Equation (2021)    Anion gap 12 5 - 15    Comment: Performed at [facility], [address] [place], [place] [address]    CT ABDOMEN PELVIS W CONTRAST  Result Date: 04/14/2021 CLINICAL DATA:  Worsening groin pain. EXAM: CT ABDOMEN AND PELVIS WITH CONTRAST TECHNIQUE: Multidetector CT imaging of the abdomen and pelvis was performed using the standard protocol following bolus administration of intravenous contrast. CONTRAST:  114mL OMNIPAQUE IOHEXOL 300 MG/ML  SOLN COMPARISON:  September 21, 2017 FINDINGS: Lower chest: Very mild posterior right basilar atelectasis and/or early infiltrate is seen. Hepatobiliary: No focal liver abnormality is seen. Status post cholecystectomy. No biliary dilatation. Pancreas: Unremarkable. No pancreatic ductal dilatation or surrounding inflammatory changes. Spleen: Normal in size without focal abnormality. Adrenals/Urinary Tract: Adrenal  glands are unremarkable. Kidneys are normal, without renal calculi, focal lesion, or hydronephrosis. Bladder is unremarkable. Stomach/Bowel: Stomach is within normal limits. The appendix is surgically absent. No evidence of bowel dilatation. Slightly asymmetric thickening of the rectosigmoid junction is noted (axial CT images 74 through 79, CT series 2). A mild amount of surrounding inflammatory fat stranding is noted. Vascular/Lymphatic: No significant vascular findings are present. No enlarged abdominal or pelvic lymph nodes. Reproductive: The prostate gland is enlarged (approximately 7.6 cm x 4.0 cm x 3.2 cm) and contains multiple abscesses. The largest measures approximately 4.9 cm x 3.6 cm x 3.8 cm. A moderate amount of surrounding inflammatory fat stranding is also seen. Other: No abdominal wall hernia or abnormality. No abdominopelvic ascites. Musculoskeletal: Degenerative changes seen at the level of L5-S1. IMPRESSION: 1. Marked severity prostatitis with multiple prostatic abscesses. 2. Findings which may represent adjacent rectal sigmoid colitis. 3. Very mild posterior right basilar atelectasis and/or early infiltrate. 4. Degenerative changes at the level of L5-S1. Electronically Signed   By: Raymond Sanchez M.D.   On: 04/14/2021 20:16    Review of Systems Blood pressure 134/87, pulse 85, temperature 98.1 F (36.7 C), temperature source Oral, resp. rate 18, height 6\' 6"  (1.981 m), weight 112 kg, SpO2 95 %. Physical Exam  Assessment/Plan:  1 - Large Prostate Abscesses - impressive by imaging, less impressive clinically. Such infections can become severe / life-threatenint. Rec STOP JARDIANCE, IV ABX, and OR TURP drainage. [place] now (sometime more sensitive in prosate abscess than UCX). RIsks, benefits, alternatives, expected peri-op course (need for few days catheter, bladder irrigation) discussed.   Raymond Sanchez 04/15/2021, 1:39 AM

## 2021-04-15 NOTE — Progress Notes (Signed)
Patient alert, oriented prior to being taken to surgery. Admission questions discussed with patient up until surgery and finished by his wife who is at bedside. Her number on surgical paperwork. She is remaining at bedside. All questions answered and needs expressed met. Call bell and room orientation completed. Vitals stable.

## 2021-04-15 NOTE — Anesthesia Procedure Notes (Signed)
Procedure Name: LMA Insertion Date/Time: 04/15/2021 2:54 PM Performed by: Gwyndolyn Saxon, CRNA Pre-anesthesia Checklist: Patient identified, Emergency Drugs available, Suction available and Patient being monitored Patient Re-evaluated:Patient Re-evaluated prior to induction Oxygen Delivery Method: Circle system utilized Preoxygenation: Pre-oxygenation with 100% oxygen Induction Type: IV induction Ventilation: Mask ventilation without difficulty LMA: LMA inserted LMA Size: 5.0 Number of attempts: 1 Placement Confirmation: positive ETCO2 and breath sounds checked- equal and bilateral Tube secured with: Tape Dental Injury: Teeth and Oropharynx as per pre-operative assessment

## 2021-04-15 NOTE — Op Note (Signed)
Preoperative diagnosis: prostate abbscess  Postoperative diagnosis: same  Procedure: 1 cystoscopy 2. Transurethral resection of the prostate 3. Drainage of Prostate abscess   Attending: Nicolette Bang  Anesthesia: General  Estimated blood loss: Minimal  Drains: 22 French foley  Specimens: 1. Prostate Chips  Antibiotics: Rocephin  Findings: 3 abscess cavities unroofed. Ureteral orifices in normal anatomic location.   Indications: Patient is a 46 year old male with a history of cute prostatitis who then developed a prostate abscess.  After discussing treatment options, they decided proceed with transurethral resection of the prostate.  Procedure in detail: The patient was brought to the operating room and a brief timeout was done to ensure correct patient, correct procedure, correct site.  General anesthesia was administered patient was placed in dorsal lithotomy position.  Their genitalia was then prepped and draped in usual sterile fashion.  A rigid 40 French cystoscope was passed in the urethra and the bladder.  Bladder was inspected and we noted no masses or lesions.  the ureteral orifices were in the normal orthotopic locations. removed the cystoscope and placed a resectoscope into the bladder. We then turned our attention to the prostate resection. . We then started at the 12 oclock position on the left lobe and resection to the 6 o'clock position from the bladder neck to the verumontanum. We encountered a very large abscess cavity which was drained. We then did the same resection of the right lobe. We encountered 2 abscess cavities in the right lobe. Once the resection was complete we then cauterized individual bleeders. We then removed the prostate chips and sent them for pathology.  We then re-inspected the prostatic fossa and found no residual bleeding.  the bladder was then drained, a 22 French foley was placed and this concluded the procedure which was well tolerated by  patient.  Complications: None  Condition: Stable, extubated, transferred to PACU  Plan: Patient is admitted overnight with continuous bladder irrigation. If their urine is clear tomorrow CBI will be discontinued. He will remain on culture specific antibiotics for 2 weeks

## 2021-04-15 NOTE — Transfer of Care (Signed)
Immediate Anesthesia Transfer of Care Note  Patient: Raymond Sanchez  Procedure(s) Performed: CYSTOSCOPY (Urethra) UNROOFING PROSTATIC ABSCESS (Prostate)  Patient Location: PACU  Anesthesia Type:General  Level of Consciousness: drowsy  Airway & Oxygen Therapy: Patient Spontanous Breathing  Post-op Assessment: Report given to RN and Post -op Vital signs reviewed and stable  Post vital signs: Reviewed and stable  Last Vitals:  Vitals Value Taken Time  BP    Temp    Pulse    Resp    SpO2      Last Pain:  Vitals:   04/15/21 1335  TempSrc: Oral  PainSc: 7       Patients Stated Pain Goal: 10 (04/15/21 1335)  Complications: No notable events documented.

## 2021-04-15 NOTE — Anesthesia Postprocedure Evaluation (Signed)
Anesthesia Post Note  Patient: Raymond Sanchez  Procedure(s) Performed: CYSTOSCOPY (Urethra) UNROOFING PROSTATIC ABSCESS (Prostate)  Patient location during evaluation: PACU Anesthesia Type: General Level of consciousness: awake and alert Pain management: pain level controlled Vital Signs Assessment: post-procedure vital signs reviewed and stable Respiratory status: spontaneous breathing, nonlabored ventilation, respiratory function stable and patient connected to nasal cannula oxygen Cardiovascular status: blood pressure returned to baseline and stable Postop Assessment: no apparent nausea or vomiting Anesthetic complications: no   No notable events documented.   Last Vitals:  Vitals:   04/15/21 1249 04/15/21 1335  BP: 137/80 129/84  Pulse: 87   Resp: 19 17  Temp: 36.8 C 36.8 C  SpO2: 99% 95%    Last Pain:  Vitals:   04/15/21 1335  TempSrc: Oral  PainSc: 7                  Windell Norfolk

## 2021-04-15 NOTE — Plan of Care (Signed)

## 2021-04-15 NOTE — Progress Notes (Signed)
Patient seen and examined. Admitted after midnight secondary to groin pain; work up demonstrating prostatic abscess and adjacent rectosigmoid colitis; patient with recent hx of failure to oral antibiotics as an outpatient for UTI/prostatitis. Hemodynamically stable and in not major distress currently. Please refer to H&P written by Dr. Josephine Cables for further info/details on admission.  Plan: -continue current antibiotics -patient will be seen by urology service and will follow recommendations regarding need for surgical intervention/ I&D. -continue PRN analgesics -follow clinical response.   Barton Dubois MD [phone]

## 2021-04-15 NOTE — Anesthesia Preprocedure Evaluation (Signed)
Anesthesia Evaluation  Patient identified by MRN, date of birth, ID band Patient awake    Reviewed: Allergy & Precautions, H&P , NPO status , Patient's Chart, lab work & pertinent test results, reviewed documented beta blocker date and time   Airway Mallampati: II  TM Distance: >3 FB Neck ROM: full    Dental no notable dental hx.    Pulmonary asthma , former smoker,    Pulmonary exam normal breath sounds clear to auscultation       Cardiovascular Exercise Tolerance: Good hypertension, negative cardio ROS   Rhythm:regular Rate:Normal     Neuro/Psych negative neurological ROS  negative psych ROS   GI/Hepatic negative GI ROS, Neg liver ROS,   Endo/Other  negative endocrine ROSdiabetes, Type 2  Renal/GU negative Renal ROS  negative genitourinary   Musculoskeletal   Abdominal   Peds  Hematology negative hematology ROS (+)   Anesthesia Other Findings   Reproductive/Obstetrics negative OB ROS                             Anesthesia Physical Anesthesia Plan  ASA: 2 and emergent  Anesthesia Plan: General and General LMA   Post-op Pain Management:    Induction:   PONV Risk Score and Plan: Ondansetron  Airway Management Planned:   Additional Equipment:   Intra-op Plan:   Post-operative Plan:   Informed Consent: I have reviewed the patients History and Physical, chart, labs and discussed the procedure including the risks, benefits and alternatives for the proposed anesthesia with the patient or authorized representative who has indicated his/her understanding and acceptance.     Dental Advisory Given  Plan Discussed with: CRNA  Anesthesia Plan Comments:         Anesthesia Quick Evaluation

## 2021-04-15 NOTE — Consult Note (Signed)
Urology Consult  Referring physician: Dr. Preston FleetingGlick Reason for referral: Prostate Abscess  Chief Complaint: perineal pain  History of Present Illness: [name] is a 46yo with a history of DMII who presented to the ER yesterday with worsening pelvic pain. He was seen in my office 12/30 for acute prostatitis and given rocephin and bactrim. His pain worsened and he developed fevers yesterday and presented to the ER. CT shows a multifocal prostate abscess. He has has improved with IV pain medication but the patient continues to have moderate pain with sitting. No other associated symtpo\ms.   Past Medical History:  Diagnosis Date   Diabetes mellitus without complication (HCC)    Flexural atopic dermatitis 08/05/2019   Hypertension    Mild intermittent asthma, uncomplicated 08/05/2019   Past Surgical History:  Procedure Laterality Date   APPENDECTOMY     ARTHROSCOPIC REPAIR ACL Right 2001   CHOLECYSTECTOMY     FOOT SURGERY     KNEE ARTHROSCOPY      Medications: I have reviewed the patient's current medications. Allergies: No Known Allergies  Family History  Problem Relation Age of Onset   Allergic rhinitis Father    Angioedema Neg Hx    Asthma Neg Hx    Atopy Neg Hx    Eczema Neg Hx    Immunodeficiency Neg Hx    Urticaria Neg Hx    Social History:  reports that he has quit smoking. His smokeless tobacco use includes chew and snuff. He reports current alcohol use. He reports that he does not use drugs.  Review of Systems  Genitourinary:  Positive for difficulty urinating and penile pain.  All other systems reviewed and are negative.  Physical Exam:  Vital signs in last 24 hours: Temp:  [98.1 F (36.7 C)] 98.1 F (36.7 C) (01/03 1754) Pulse Rate:  [72-109] 85 (01/04 0600) Resp:  [18-20] 18 (01/04 0600) BP: (112-164)/(70-97) 112/82 (01/04 0600) SpO2:  [95 %-99 %] 96 % (01/04 0600) Weight:  [161[112 kg] 112 kg (01/03 1754) Physical Exam Vitals and nursing note reviewed.   Constitutional:      Appearance: Normal appearance.  HENT:     Head: Normocephalic and atraumatic.     Mouth/Throat:     Mouth: Mucous membranes are dry.  Eyes:     Extraocular Movements: Extraocular movements intact.     Pupils: Pupils are equal, round, and reactive to light.  Cardiovascular:     Rate and Rhythm: Normal rate and regular rhythm.  Pulmonary:     Effort: Pulmonary effort is normal. No respiratory distress.  Abdominal:     General: Abdomen is flat. There is no distension.  Musculoskeletal:        General: No swelling. Normal range of motion.     Cervical back: Normal range of motion and neck supple.  Skin:    General: Skin is warm and dry.  Neurological:     General: No focal deficit present.     Mental Status: He is alert and oriented to person, place, and time.  Psychiatric:        Mood and Affect: Mood normal.        Behavior: Behavior normal.        Thought Content: Thought content normal.        Judgment: Judgment normal.    Laboratory Data:  Results for orders placed or performed during the hospital encounter of 04/14/21 (from the past 72 hour(s))  CBC with Differential     Status: None  Collection Time: 04/14/21  6:23 PM  Result Value Ref Range   WBC 9.8 4.0 - 10.5 K/uL   RBC 4.86 4.22 - 5.81 MIL/uL   Hemoglobin 14.5 13.0 - 17.0 g/dL   HCT 16.1 09.6 - 04.5 %   MCV 88.7 80.0 - 100.0 fL   MCH 29.8 26.0 - 34.0 pg   MCHC 33.6 30.0 - 36.0 g/dL   RDW 40.9 81.1 - 91.4 %   Platelets 324 150 - 400 K/uL   nRBC 0.0 0.0 - 0.2 %   Neutrophils Relative % 72 %   Neutro Abs 7.1 1.7 - 7.7 K/uL   Lymphocytes Relative 15 %   Lymphs Abs 1.5 0.7 - 4.0 K/uL   Monocytes Relative 7 %   Monocytes Absolute 0.7 0.1 - 1.0 K/uL   Eosinophils Relative 5 %   Eosinophils Absolute 0.5 0.0 - 0.5 K/uL   Basophils Relative 0 %   Basophils Absolute 0.0 0.0 - 0.1 K/uL   Immature Granulocytes 1 %   Abs Immature Granulocytes 0.05 0.00 - 0.07 K/uL    Comment: Performed at  [facility], [address] [place], [place] [address]  Basic metabolic panel     Status: Abnormal   Collection Time: 04/14/21  6:23 PM  Result Value Ref Range   Sodium 136 135 - 145 mmol/L   Potassium 4.8 3.5 - 5.1 mmol/L   Chloride 98 98 - 111 mmol/L   CO2 26 22 - 32 mmol/L   Glucose, Bld 172 (H) 70 - 99 mg/dL    Comment: Glucose reference range applies only to samples taken after fasting for at least 8 hours.   BUN 14 6 - 20 mg/dL   Creatinine, Ser 6.21 0.61 - 1.24 mg/dL   Calcium 9.3 8.9 - 30.8 mg/dL   GFR, Estimated >65 >78 mL/min    Comment: (NOTE) Calculated using the CKD-EPI Creatinine Equation (2021)    Anion gap 12 5 - 15    Comment: Performed at [facility], [address], [place], [place] [address]  Resp Panel by RT-PCR (Flu A&B, Covid) Nasopharyngeal Swab     Status: None   Collection Time: 04/15/21  1:58 AM   Specimen: Nasopharyngeal Swab; Nasopharyngeal(NP) swabs in vial transport medium  Result Value Ref Range   SARS Coronavirus 2 by RT PCR NEGATIVE NEGATIVE    Comment: (NOTE) SARS-CoV-2 target nucleic acids are NOT DETECTED.  The SARS-CoV-2 RNA is generally detectable in upper respiratory specimens during the acute phase of infection. The lowest concentration of SARS-CoV-2 viral copies this assay can detect is 138 copies/mL. A negative result does not preclude SARS-Cov-2 infection and should not be used as the sole basis for treatment or other patient management decisions. A negative result may occur with  improper specimen collection/handling, submission of specimen other than nasopharyngeal swab, presence of viral mutation(s) within the areas targeted by this assay, and inadequate number of viral copies(<138 copies/mL). A negative result must be combined with clinical observations, patient history, and epidemiological information. The expected result is Negative.  Fact Sheet for Patients:  BloggerCourse.com  Fact Sheet for  Healthcare Providers:  SeriousBroker.it  This test is no t yet approved or cleared by the [place] FDA and  has been authorized for detection and/or diagnosis of SARS-CoV-2 by FDA under an Emergency Use Authorization (EUA). This EUA will remain  in effect (meaning this test can be used) for the duration of the COVID-19 declaration under Section 564(b)(1) of the Act, 21 U.S.C.section  360bbb-3(b)(1), unless the authorization is terminated  or revoked sooner.       Influenza A by PCR NEGATIVE NEGATIVE   Influenza B by PCR NEGATIVE NEGATIVE    Comment: (NOTE) The Xpert Xpress SARS-CoV-2/FLU/RSV plus assay is intended as an aid in the diagnosis of influenza from Nasopharyngeal swab specimens and should not be used as a sole basis for treatment. Nasal washings and aspirates are unacceptable for Xpert Xpress SARS-CoV-2/FLU/RSV testing.  Fact Sheet for Patients: BloggerCourse.com  Fact Sheet for Healthcare Providers: SeriousBroker.it  This test is not yet approved or cleared by the [place] FDA and has been authorized for detection and/or diagnosis of SARS-CoV-2 by FDA under an Emergency Use Authorization (EUA). This EUA will remain in effect (meaning this test can be used) for the duration of the COVID-19 declaration under Section 564(b)(1) of the Act, 21 U.S.C. section 360bbb-3(b)(1), unless the authorization is terminated or revoked.  Performed at [facility], [address], [place], [place] [address]   Hemoglobin A1c     Status: Abnormal   Collection Time: 04/15/21  2:22 AM  Result Value Ref Range   Hgb A1c MFr Bld 10.4 (H) 4.8 - 5.6 %    Comment: (NOTE) Pre diabetes:          5.7%-6.4%  Diabetes:              >6.4%  Glycemic control for   <7.0% adults with diabetes    Mean Plasma Glucose 251.78 mg/dL    Comment: Performed at [facility], [address] [place], [place]  [address]  Culture, blood (routine x 2)     Status: None (Preliminary result)   Collection Time: 04/15/21  2:22 AM   Specimen: BLOOD LEFT HAND  Result Value Ref Range   Specimen Description BLOOD LEFT HAND    Special Requests      BOTTLES DRAWN AEROBIC AND ANAEROBIC Blood Culture results may not be optimal due to an excessive volume of blood received in culture bottles   Culture      NO GROWTH < 12 HOURS Performed at [facility], [address], [place], [place] [address]    Report Status PENDING   Culture, blood (routine x 2)     Status: None (Preliminary result)   Collection Time: 04/15/21  2:22 AM   Specimen: Left Antecubital; Blood  Result Value Ref Range   Specimen Description LEFT ANTECUBITAL    Special Requests      BOTTLES DRAWN AEROBIC AND ANAEROBIC Blood Culture results may not be optimal due to an excessive volume of blood received in culture bottles   Culture      NO GROWTH < 12 HOURS Performed at [facility], [address], [place], [place] [address]    Report Status PENDING   Comprehensive metabolic panel     Status: Abnormal   Collection Time: 04/15/21  2:22 AM  Result Value Ref Range   Sodium 132 (L) 135 - 145 mmol/L   Potassium 3.7 3.5 - 5.1 mmol/L    Comment: DELTA CHECK NOTED   Chloride 98 98 - 111 mmol/L   CO2 23 22 - 32 mmol/L   Glucose, Bld 150 (H) 70 - 99 mg/dL    Comment: Glucose reference range applies only to samples taken after fasting for at least 8 hours.   BUN 14 6 - 20 mg/dL   Creatinine, Ser 4.97 0.61 - 1.24 mg/dL   Calcium 8.4 (L) 8.9 - 10.3 mg/dL  Total Protein 7.3 6.5 - 8.1 g/dL   Albumin 3.4 (L) 3.5 - 5.0 g/dL   AST 14 (L) 15 - 41 U/L   ALT 13 0 - 44 U/L   Alkaline Phosphatase 75 38 - 126 U/L   Total Bilirubin 0.6 0.3 - 1.2 mg/dL   GFR, Estimated >01 >09 mL/min    Comment: (NOTE) Calculated using the CKD-EPI Creatinine Equation (2021)    Anion gap 11 5 - 15    Comment: Performed at [facility], [address], [place], [place]  [address]  CBC     Status: None   Collection Time: 04/15/21  2:22 AM  Result Value Ref Range   WBC 10.0 4.0 - 10.5 K/uL   RBC 4.55 4.22 - 5.81 MIL/uL   Hemoglobin 13.7 13.0 - 17.0 g/dL   HCT 73.2 20.2 - 54.2 %   MCV 87.7 80.0 - 100.0 fL   MCH 30.1 26.0 - 34.0 pg   MCHC 34.3 30.0 - 36.0 g/dL   RDW 70.6 23.7 - 62.8 %   Platelets 346 150 - 400 K/uL   nRBC 0.0 0.0 - 0.2 %    Comment: Performed at [facility], [address], [place], [place] [address]  Magnesium     Status: None   Collection Time: 04/15/21  2:22 AM  Result Value Ref Range   Magnesium 2.1 1.7 - 2.4 mg/dL    Comment: Performed at [facility], [address] [place], [place] [address]  Phosphorus     Status: None   Collection Time: 04/15/21  2:22 AM  Result Value Ref Range   Phosphorus 3.4 2.5 - 4.6 mg/dL    Comment: Performed at [facility], [address], [place], [place] [address]  CBG monitoring, ED     Status: Abnormal   Collection Time: 04/15/21  4:31 AM  Result Value Ref Range   Glucose-Capillary 118 (H) 70 - 99 mg/dL    Comment: Glucose reference range applies only to samples taken after fasting for at least 8 hours.  CBG monitoring, ED     Status: Abnormal   Collection Time: 04/15/21  8:53 AM  Result Value Ref Range   Glucose-Capillary 115 (H) 70 - 99 mg/dL    Comment: Glucose reference range applies only to samples taken after fasting for at least 8 hours.   Recent Results (from the past 240 hour(s))  Microscopic Examination     Status: Abnormal   Collection Time: 04/10/21 12:43 PM   Urine  Result Value Ref Range Status   WBC, UA 6-10 (A) 0 - 5 /hpf Final   RBC 11-30 (A) 0 - 2 /hpf Final   Epithelial Cells (non renal) 0-10 0 - 10 /hpf Final   Renal Epithel, UA None seen None seen /hpf Final   Bacteria, UA Few None seen/Few Final  Urine Culture     Status: None   Collection Time: 04/10/21  1:35 PM   Specimen: Urine   Urine  Result Value Ref Range Status   Urine Culture, Routine Final report  Final    Organism ID, Bacteria No growth  Final  Resp Panel by RT-PCR (Flu A&B, Covid) Nasopharyngeal Swab     Status: None   Collection Time: 04/15/21  1:58 AM   Specimen: Nasopharyngeal Swab; Nasopharyngeal(NP) swabs in vial transport medium  Result Value Ref Range Status   SARS Coronavirus 2 by RT PCR NEGATIVE NEGATIVE Final    Comment: (NOTE) SARS-CoV-2 target nucleic acids are NOT DETECTED.  The SARS-CoV-2 RNA is generally detectable in upper respiratory specimens during the acute phase of infection. The lowest concentration of SARS-CoV-2 viral copies this assay can detect is 138 copies/mL. A negative result does not preclude SARS-Cov-2 infection and should not be used as the sole basis for treatment or other patient management decisions. A negative result may occur with  improper specimen collection/handling, submission of specimen other than nasopharyngeal swab, presence of viral mutation(s) within the areas targeted by this assay, and inadequate number of viral copies(<138 copies/mL). A negative result must be combined with clinical observations, patient history, and epidemiological information. The expected result is Negative.  Fact Sheet for Patients:  BloggerCourse.comhttps://www.fda.gov/media/152166/download  Fact Sheet for Healthcare Providers:  SeriousBroker.ithttps://www.fda.gov/media/152162/download  This test is no t yet approved or cleared by the [place] FDA and  has been authorized for detection and/or diagnosis of SARS-CoV-2 by FDA under an Emergency Use Authorization (EUA). This EUA will remain  in effect (meaning this test can be used) for the duration of the COVID-19 declaration under Section 564(b)(1) of the Act, 21 U.S.C.section 360bbb-3(b)(1), unless the authorization is terminated  or revoked sooner.       Influenza A by PCR NEGATIVE NEGATIVE Final   Influenza B by PCR NEGATIVE NEGATIVE Final    Comment: (NOTE) The Xpert Xpress SARS-CoV-2/FLU/RSV plus assay is intended as an  aid in the diagnosis of influenza from Nasopharyngeal swab specimens and should not be used as a sole basis for treatment. Nasal washings and aspirates are unacceptable for Xpert Xpress SARS-CoV-2/FLU/RSV testing.  Fact Sheet for Patients: BloggerCourse.comhttps://www.fda.gov/media/152166/download  Fact Sheet for Healthcare Providers: SeriousBroker.ithttps://www.fda.gov/media/152162/download  This test is not yet approved or cleared by the [place] FDA and has been authorized for detection and/or diagnosis of SARS-CoV-2 by FDA under an Emergency Use Authorization (EUA). This EUA will remain in effect (meaning this test can be used) for the duration of the COVID-19 declaration under Section 564(b)(1) of the Act, 21 U.S.C. section 360bbb-3(b)(1), unless the authorization is terminated or revoked.  Performed at [facility], [address] [place], [place] [address]   Culture, blood (routine x 2)     Status: None (Preliminary result)   Collection Time: 04/15/21  2:22 AM   Specimen: BLOOD LEFT HAND  Result Value Ref Range Status   Specimen Description BLOOD LEFT HAND  Final   Special Requests   Final    BOTTLES DRAWN AEROBIC AND ANAEROBIC Blood Culture results may not be optimal due to an excessive volume of blood received in culture bottles   Culture   Final    NO GROWTH < 12 HOURS Performed at [facility], [address] [place], [place] [address]    Report Status PENDING  Incomplete  Culture, blood (routine x 2)     Status: None (Preliminary result)   Collection Time: 04/15/21  2:22 AM   Specimen: Left Antecubital; Blood  Result Value Ref Range Status   Specimen Description LEFT ANTECUBITAL  Final   Special Requests   Final    BOTTLES DRAWN AEROBIC AND ANAEROBIC Blood Culture results may not be optimal due to an excessive volume of blood received in culture bottles   Culture   Final    NO GROWTH < 12 HOURS Performed at [facility], [address] [place], [place] [address]    Report Status PENDING   Incomplete   Creatinine: Recent Labs    04/14/21 1823 04/15/21 0222  CREATININE 0.79 0.67   Baseline Creatinine: 0.7  Impression/Assessment:  45yo with prostate abscess  Plan:  I discussed the natural history of prostates abscesses and the management including endoscopic drainage of his prostatic abscess. After discussing the treatment option the patient elects to proceed with surgery. Risks/benefits/alternatives to cystoscopy with endoscopic resection of prostatic abscess was explained to the patient  Wilkie Aye 04/15/2021, 9:38 AM

## 2021-04-16 ENCOUNTER — Encounter (HOSPITAL_COMMUNITY): Payer: Self-pay | Admitting: Urology

## 2021-04-16 DIAGNOSIS — K529 Noninfective gastroenteritis and colitis, unspecified: Secondary | ICD-10-CM | POA: Diagnosis not present

## 2021-04-16 DIAGNOSIS — N419 Inflammatory disease of prostate, unspecified: Secondary | ICD-10-CM

## 2021-04-16 DIAGNOSIS — N412 Abscess of prostate: Secondary | ICD-10-CM | POA: Diagnosis not present

## 2021-04-16 DIAGNOSIS — E1165 Type 2 diabetes mellitus with hyperglycemia: Secondary | ICD-10-CM | POA: Diagnosis not present

## 2021-04-16 DIAGNOSIS — J4520 Mild intermittent asthma, uncomplicated: Secondary | ICD-10-CM | POA: Diagnosis not present

## 2021-04-16 LAB — GLUCOSE, CAPILLARY
Glucose-Capillary: 145 mg/dL — ABNORMAL HIGH (ref 70–99)
Glucose-Capillary: 161 mg/dL — ABNORMAL HIGH (ref 70–99)
Glucose-Capillary: 178 mg/dL — ABNORMAL HIGH (ref 70–99)
Glucose-Capillary: 279 mg/dL — ABNORMAL HIGH (ref 70–99)
Glucose-Capillary: 218 mg/dL — ABNORMAL HIGH (ref 70–99)

## 2021-04-16 MED ORDER — DOCUSATE SODIUM 100 MG PO CAPS
100.0000 mg | ORAL_CAPSULE | Freq: Two times a day (BID) | ORAL | Status: DC
  Administered 2021-04-16 (×2): 100 mg via ORAL
  Filled 2021-04-16 (×3): qty 1

## 2021-04-16 MED ORDER — SODIUM CHLORIDE 0.9 % IR SOLN: 3000.0000 mL | Status: DC

## 2021-04-16 MED ORDER — POLYETHYLENE GLYCOL 3350 17 G PO PACK
17.0000 g | PACK | Freq: Every day | ORAL | Status: DC
  Filled 2021-04-16 (×2): qty 1

## 2021-04-16 NOTE — Progress Notes (Signed)
1 Day Post-Op Subjective: Patient reports improved perineal pain. Urine light red on slow drip CBI. No fevers overnight  Objective: Vital signs in last 24 hours: Temp:  [97 F (36.1 C)-98.6 F (37 C)] 98 F (36.7 C) (01/05 0356) Pulse Rate:  [75-94] 76 (01/05 0356) Resp:  [13-20] 20 (01/05 0356) BP: (115-137)/(71-87) 125/75 (01/05 0356) SpO2:  [92 %-99 %] 97 % (01/05 0356)  Intake/Output from previous day: 01/04 0701 - 01/05 0700 In: 7250 [P.O.:240; I.V.:210; IV Piggyback:800] Out: 46 [Urine:7700; Blood:10] Intake/Output this shift: Total I/O In: 300 [P.O.:300] Out: -   Physical Exam:  General:alert, cooperative, and appears stated age GI: soft, non tender, normal bowel sounds, no palpable masses, no organomegaly, no inguinal hernia Male genitalia: not done Extremities: extremities normal, atraumatic, no cyanosis or edema  Lab Results: Recent Labs    04/14/21 1823 04/15/21 0222  HGB 14.5 13.7  HCT 43.1 39.9   BMET Recent Labs    04/14/21 1823 04/15/21 0222  NA 136 132*  K 4.8 3.7  CL 98 98  CO2 26 23  GLUCOSE 172* 150*  BUN 14 14  CREATININE 0.79 0.67  CALCIUM 9.3 8.4*   No results for input(s): LABPT, INR in the last 72 hours. No results for input(s): LABURIN in the last 72 hours. Results for orders placed or performed during the hospital encounter of 04/14/21  Resp Panel by RT-PCR (Flu A&B, Covid) Nasopharyngeal Swab     Status: None   Collection Time: 04/15/21  1:58 AM   Specimen: Nasopharyngeal Swab; Nasopharyngeal(NP) swabs in vial transport medium  Result Value Ref Range Status   SARS Coronavirus 2 by RT PCR NEGATIVE NEGATIVE Final    Comment: (NOTE) SARS-CoV-2 target nucleic acids are NOT DETECTED.  The SARS-CoV-2 RNA is generally detectable in upper respiratory specimens during the acute phase of infection. The lowest concentration of SARS-CoV-2 viral copies this assay can detect is 138 copies/mL. A negative result does not preclude  SARS-Cov-2 infection and should not be used as the sole basis for treatment or other patient management decisions. A negative result may occur with  improper specimen collection/handling, submission of specimen other than nasopharyngeal swab, presence of viral mutation(s) within the areas targeted by this assay, and inadequate number of viral copies(<138 copies/mL). A negative result must be combined with clinical observations, patient history, and epidemiological information. The expected result is Negative.  Fact Sheet for Patients:  EntrepreneurPulse.com.au  Fact Sheet for Healthcare Providers:  IncredibleEmployment.be  This test is no t yet approved or cleared by the [place] FDA and  has been authorized for detection and/or diagnosis of SARS-CoV-2 by FDA under an Emergency Use Authorization (EUA). This EUA will remain  in effect (meaning this test can be used) for the duration of the COVID-19 declaration under Section 564(b)(1) of the Act, 21 U.S.C.section 360bbb-3(b)(1), unless the authorization is terminated  or revoked sooner.       Influenza A by PCR NEGATIVE NEGATIVE Final   Influenza B by PCR NEGATIVE NEGATIVE Final    Comment: (NOTE) The Xpert Xpress SARS-CoV-2/FLU/RSV plus assay is intended as an aid in the diagnosis of influenza from Nasopharyngeal swab specimens and should not be used as a sole basis for treatment. Nasal washings and aspirates are unacceptable for Xpert Xpress SARS-CoV-2/FLU/RSV testing.  Fact Sheet for Patients: EntrepreneurPulse.com.au  Fact Sheet for Healthcare Providers: IncredibleEmployment.be  This test is not yet approved or cleared by the [place] FDA and has been authorized for detection and/or  diagnosis of SARS-CoV-2 by FDA under an Emergency Use Authorization (EUA). This EUA will remain in effect (meaning this test can be used) for the duration of  the COVID-19 declaration under Section 564(b)(1) of the Act, 21 U.S.C. section 360bbb-3(b)(1), unless the authorization is terminated or revoked.  Performed at [facility], [address], [place], [place] [address]   Culture, blood (routine x 2)     Status: None (Preliminary result)   Collection Time: 04/15/21  2:22 AM   Specimen: BLOOD LEFT HAND  Result Value Ref Range Status   Specimen Description BLOOD LEFT HAND  Final   Special Requests   Final    BOTTLES DRAWN AEROBIC AND ANAEROBIC Blood Culture results may not be optimal due to an excessive volume of blood received in culture bottles   Culture   Final    NO GROWTH 1 DAY Performed at [facility], [address], [place], [place] [address]    Report Status PENDING  Incomplete  Culture, blood (routine x 2)     Status: None (Preliminary result)   Collection Time: 04/15/21  2:22 AM   Specimen: Left Antecubital; Blood  Result Value Ref Range Status   Specimen Description LEFT ANTECUBITAL  Final   Special Requests   Final    BOTTLES DRAWN AEROBIC AND ANAEROBIC Blood Culture results may not be optimal due to an excessive volume of blood received in culture bottles   Culture   Final    NO GROWTH 1 DAY Performed at [facility], [address], [place], [place] [address]    Report Status PENDING  Incomplete  Surgical pcr screen     Status: Abnormal   Collection Time: 04/15/21  1:05 PM   Specimen: Nasal Mucosa; Nasal Swab  Result Value Ref Range Status   MRSA, PCR NEGATIVE NEGATIVE Final   Staphylococcus aureus POSITIVE (A) NEGATIVE Final    Comment: CRITICAL RESULT CALLED TO, READ BACK BY AND VERIFIED WITH: BLACKWELL,S AT 1814 ON 1.4.23 BY ISLEY,B (NOTE) The Xpert SA Assay (FDA approved for NASAL specimens in patients 59 years of age and older), is one component of a comprehensive surveillance program. It is not intended to diagnose infection nor to guide or monitor treatment. Performed at [facility], [address]  [place], [place] [address]     Studies/Results: CT ABDOMEN PELVIS W CONTRAST  Result Date: 04/14/2021 CLINICAL DATA:  Worsening groin pain. EXAM: CT ABDOMEN AND PELVIS WITH CONTRAST TECHNIQUE: Multidetector CT imaging of the abdomen and pelvis was performed using the standard protocol following bolus administration of intravenous contrast. CONTRAST:  158mL OMNIPAQUE IOHEXOL 300 MG/ML  SOLN COMPARISON:  September 21, 2017 FINDINGS: Lower chest: Very mild posterior right basilar atelectasis and/or early infiltrate is seen. Hepatobiliary: No focal liver abnormality is seen. Status post cholecystectomy. No biliary dilatation. Pancreas: Unremarkable. No pancreatic ductal dilatation or surrounding inflammatory changes. Spleen: Normal in size without focal abnormality. Adrenals/Urinary Tract: Adrenal glands are unremarkable. Kidneys are normal, without renal calculi, focal lesion, or hydronephrosis. Bladder is unremarkable. Stomach/Bowel: Stomach is within normal limits. The appendix is surgically absent. No evidence of bowel dilatation. Slightly asymmetric thickening of the rectosigmoid junction is noted (axial CT images 74 through 79, CT series 2). A mild amount of surrounding inflammatory fat stranding is noted. Vascular/Lymphatic: No significant vascular findings are present. No enlarged abdominal or pelvic lymph nodes. Reproductive: The prostate gland is enlarged (approximately 7.6 cm x 4.0 cm x 3.2 cm) and contains multiple abscesses. The largest measures approximately  4.9 cm x 3.6 cm x 3.8 cm. A moderate amount of surrounding inflammatory fat stranding is also seen. Other: No abdominal wall hernia or abnormality. No abdominopelvic ascites. Musculoskeletal: Degenerative changes seen at the level of L5-S1. IMPRESSION: 1. Marked severity prostatitis with multiple prostatic abscesses. 2. Findings which may represent adjacent rectal sigmoid colitis. 3. Very mild posterior right basilar atelectasis and/or early infiltrate. 4.  Degenerative changes at the level of L5-S1. Electronically Signed   By: Virgina Norfolk M.D.   On: 04/14/2021 20:16    Assessment/Plan: POD#1 Prostatic abscess drainage  Continue broad spectrum antibiotics pending urine culture CBI weaned to off   LOS: 1 day   Nicolette Bang 04/16/2021, 9:30 AM

## 2021-04-16 NOTE — Progress Notes (Signed)
Patient refused Pulmicort nebulizer. SpO2 97% RA, BBS clear and he did not appear in respiratory distress.He was reminded of availability of Albuterol treatment if needed.

## 2021-04-16 NOTE — Progress Notes (Signed)
PROGRESS NOTE    Raymond Sanchez  [id number] DOB: Sep 20, 1975 DOA: 04/14/2021 PCP: Lavella Lemons, PA   Chief Complaint  Patient presents with   Groin Pain    Brief Narrative:  As per H&P written by Dr. Josephine Cables on 04/15/2021 Raymond Sanchez is a 46 y.o. male with medical history significant for hypertension, T2DM, asthma who presents to the emergency department due to 10 day onset of groin pain which was rated as 8-9/10, he also complained of pressure on urination with decreased urine flow.  He followed up with his PCP who ordered testicular ultrasound due to concern of possible kidney stone.  Patient follows with a urologist about 5 days ago and he was diagnosed to have prostatitis, IV ceftriaxone was given, Bactrim was prescribed, he was told to follow-up if symptoms do not improve within 2 to 3 days, since there was no improvement in symptom, ED contacted his urologist we will have him to go to the ED to get a CT scan done.  He denies chest pain, shortness of breath, fever, nausea, vomiting or abdominal pain.   ED Course:  In the emergency department, he was hemodynamically stable.  Work-up in the ED showed normal CBC and BMP except for hyperglycemia.   CT abdomen and pelvis with contrast showed marked severity prostatitis with multiple prostatic abscesses.  Findings which may represent adjacent rectosigmoid colitis.  Plan patient was started on IV ciprofloxacin and metronidazole. Urologist on-call (Dr. Tresa Moore) was consulted and recommended transferring patient to [facility] for surgical management per ED physician.  Hospitalist was asked to admit patient for further evaluation and management.    Assessment & Plan: 1-prostatitis and prostatic abscess -Status post prostate abscess drainage by urology service on 04/15/2021 -Continue current IV antibiotics -Currently with Foley catheter in place -Patient reports improvement in his perineum pain -Afebrile -WBCs trending  down. -Follow culture results. -Stopping CBI  2-Colitis/constipation -Probably in the setting of 1 -Continue current antibiotic -As needed laxative regimen has been added. -Maintain adequate hydration.  3-Asthma -Mild intermittent -No wheezing on exam -Good saturation and no requiring supplementation. -Continue the use of Qvar equivalent, Singulair and as needed bronchodilator.  4-Hyperglycemia due to diabetes mellitus (HCC) -Continue sliding scale insulin -Follow CBGs and adjust regimen as needed -Infection may be contributing to elevated blood sugar. -Will follow recommendations by diabetes coordinator service.  5-BPH -Currently with Foley in place after surgical intervention; per urology plan we will keep Foley for 5 days -Continue Flomax.   DVT prophylaxis: SCD's Code Status: Full code Family Communication: Son at bedside. Disposition:   Status is: Inpatient    Consultants:  Urology service.  Procedures:  See below for x-ray reports. Status post prostatic abscess drainage on 04/15/2021  Antimicrobials: Ciprofloxacin and Flagyl   Subjective: Afebrile, complaining of constipation; no nausea, no vomiting, reports improvement in his perineum pain.  Foley catheter demonstrating light red urine collected.  Objective: Vitals:   04/15/21 2026 04/16/21 0047 04/16/21 0356 04/16/21 0949  BP: 127/71 115/74 125/75   Pulse: 76 81 76   Resp: 20 20 20    Temp: 98 F (36.7 C) 98.4 F (36.9 C) 98 F (36.7 C)   TempSrc:      SpO2: 97% 97% 97% 97%  Weight:      Height:        Intake/Output Summary (Last 24 hours) at 04/16/2021 1349 Last data filed at 04/16/2021 0900 Gross per 24 hour  Intake 7550 ml  Output 7210  ml  Net 340 ml   Filed Weights   04/14/21 1754  Weight: 112 kg    Examination:  General exam: Appears calm and comfortable; no fever overnight; reported improvement in his perineum pain.  Patient denies nausea or vomiting. Respiratory system: Clear to  auscultation.  Good air movement bilaterally; no using accessory muscle.  Good saturation on room air. Cardiovascular system: S1 & S2 heard, RRR. No JVD, murmurs, rubs, gallops or clicks. No pedal edema. Gastrointestinal system: Abdomen is nondistended, soft and without guarding; positive bowel sounds appreciated.  Foley catheter in place. Central nervous system: Alert and oriented. No focal neurological deficits. Extremities: No cyanosis, no clubbing, no edema. Skin: No petechiae. Psychiatry: Judgement and insight appear normal. Mood & affect appropriate.     Data Reviewed: I have personally reviewed following labs and imaging studies  CBC: Recent Labs  Lab 04/14/21 1823 04/15/21 0222  WBC 9.8 10.0  NEUTROABS 7.1  --   HGB 14.5 13.7  HCT 43.1 39.9  MCV 88.7 87.7  PLT 324 [id number]    Basic Metabolic Panel: Recent Labs  Lab 04/14/21 1823 04/15/21 0222  NA 136 132*  K 4.8 3.7  CL 98 98  CO2 26 23  GLUCOSE 172* 150*  BUN 14 14  CREATININE 0.79 0.67  CALCIUM 9.3 8.4*  MG  --  2.1  PHOS  --  3.4    GFR: Estimated Creatinine Clearance: 167.7 mL/min (by C-G formula based on SCr of 0.67 mg/dL).  Liver Function Tests: Recent Labs  Lab 04/15/21 0222  AST 14*  ALT 13  ALKPHOS 75  BILITOT 0.6  PROT 7.3  ALBUMIN 3.4*    CBG: Recent Labs  Lab 04/15/21 2027 04/15/21 2349 04/16/21 0403 04/16/21 0758 04/16/21 1106  GLUCAP 255* 209* 145* 161* 178*     Recent Results (from the past 240 hour(s))  Microscopic Examination     Status: Abnormal   Collection Time: 04/10/21 12:43 PM   Urine  Result Value Ref Range Status   WBC, UA 6-10 (A) 0 - 5 /hpf Final   RBC 11-30 (A) 0 - 2 /hpf Final   Epithelial Cells (non renal) 0-10 0 - 10 /hpf Final   Renal Epithel, UA None seen None seen /hpf Final   Bacteria, UA Few None seen/Few Final  Urine Culture     Status: None   Collection Time: 04/10/21  1:35 PM   Specimen: Urine   Urine  Result Value Ref Range Status   Urine  Culture, Routine Final report  Final   Organism ID, Bacteria No growth  Final  Resp Panel by RT-PCR (Flu A&B, Covid) Nasopharyngeal Swab     Status: None   Collection Time: 04/15/21  1:58 AM   Specimen: Nasopharyngeal Swab; Nasopharyngeal(NP) swabs in vial transport medium  Result Value Ref Range Status   SARS Coronavirus 2 by RT PCR NEGATIVE NEGATIVE Final    Comment: (NOTE) SARS-CoV-2 target nucleic acids are NOT DETECTED.  The SARS-CoV-2 RNA is generally detectable in upper respiratory specimens during the acute phase of infection. The lowest concentration of SARS-CoV-2 viral copies this assay can detect is 138 copies/mL. A negative result does not preclude SARS-Cov-2 infection and should not be used as the sole basis for treatment or other patient management decisions. A negative result may occur with  improper specimen collection/handling, submission of specimen other than nasopharyngeal swab, presence of viral mutation(s) within the areas targeted by this assay, and inadequate number of viral copies(<138 copies/mL).  A negative result must be combined with clinical observations, patient history, and epidemiological information. The expected result is Negative.  Fact Sheet for Patients:  EntrepreneurPulse.com.au  Fact Sheet for Healthcare Providers:  IncredibleEmployment.be  This test is no t yet approved or cleared by the [place] FDA and  has been authorized for detection and/or diagnosis of SARS-CoV-2 by FDA under an Emergency Use Authorization (EUA). This EUA will remain  in effect (meaning this test can be used) for the duration of the COVID-19 declaration under Section 564(b)(1) of the Act, 21 U.S.C.section 360bbb-3(b)(1), unless the authorization is terminated  or revoked sooner.       Influenza A by PCR NEGATIVE NEGATIVE Final   Influenza B by PCR NEGATIVE NEGATIVE Final    Comment: (NOTE) The Xpert Xpress  SARS-CoV-2/FLU/RSV plus assay is intended as an aid in the diagnosis of influenza from Nasopharyngeal swab specimens and should not be used as a sole basis for treatment. Nasal washings and aspirates are unacceptable for Xpert Xpress SARS-CoV-2/FLU/RSV testing.  Fact Sheet for Patients: EntrepreneurPulse.com.au  Fact Sheet for Healthcare Providers: IncredibleEmployment.be  This test is not yet approved or cleared by the [place] FDA and has been authorized for detection and/or diagnosis of SARS-CoV-2 by FDA under an Emergency Use Authorization (EUA). This EUA will remain in effect (meaning this test can be used) for the duration of the COVID-19 declaration under Section 564(b)(1) of the Act, 21 U.S.C. section 360bbb-3(b)(1), unless the authorization is terminated or revoked.  Performed at [facility], [address], [place], [place] [address]   Culture, blood (routine x 2)     Status: None (Preliminary result)   Collection Time: 04/15/21  2:22 AM   Specimen: BLOOD LEFT HAND  Result Value Ref Range Status   Specimen Description BLOOD LEFT HAND  Final   Special Requests   Final    BOTTLES DRAWN AEROBIC AND ANAEROBIC Blood Culture results may not be optimal due to an excessive volume of blood received in culture bottles   Culture   Final    NO GROWTH 1 DAY Performed at [facility], [address] [place], [place] [address]    Report Status PENDING  Incomplete  Culture, blood (routine x 2)     Status: None (Preliminary result)   Collection Time: 04/15/21  2:22 AM   Specimen: Left Antecubital; Blood  Result Value Ref Range Status   Specimen Description LEFT ANTECUBITAL  Final   Special Requests   Final    BOTTLES DRAWN AEROBIC AND ANAEROBIC Blood Culture results may not be optimal due to an excessive volume of blood received in culture bottles   Culture   Final    NO GROWTH 1 DAY Performed at [facility], [address],  [place], [place] [address]    Report Status PENDING  Incomplete  Surgical pcr screen     Status: Abnormal   Collection Time: 04/15/21  1:05 PM   Specimen: Nasal Mucosa; Nasal Swab  Result Value Ref Range Status   MRSA, PCR NEGATIVE NEGATIVE Final   Staphylococcus aureus POSITIVE (A) NEGATIVE Final    Comment: CRITICAL RESULT CALLED TO, READ BACK BY AND VERIFIED WITH: BLACKWELL,S AT 1814 ON 1.4.23 BY ISLEY,B (NOTE) The Xpert SA Assay (FDA approved for NASAL specimens in patients 60 years of age and older), is one component of a comprehensive surveillance program. It is not intended to diagnose infection nor to guide or monitor treatment. Performed at [facility], [address]  [place], [place] [address]      Radiology Studies: CT ABDOMEN PELVIS W CONTRAST  Result Date: 04/14/2021 CLINICAL DATA:  Worsening groin pain. EXAM: CT ABDOMEN AND PELVIS WITH CONTRAST TECHNIQUE: Multidetector CT imaging of the abdomen and pelvis was performed using the standard protocol following bolus administration of intravenous contrast. CONTRAST:  173mL OMNIPAQUE IOHEXOL 300 MG/ML  SOLN COMPARISON:  September 21, 2017 FINDINGS: Lower chest: Very mild posterior right basilar atelectasis and/or early infiltrate is seen. Hepatobiliary: No focal liver abnormality is seen. Status post cholecystectomy. No biliary dilatation. Pancreas: Unremarkable. No pancreatic ductal dilatation or surrounding inflammatory changes. Spleen: Normal in size without focal abnormality. Adrenals/Urinary Tract: Adrenal glands are unremarkable. Kidneys are normal, without renal calculi, focal lesion, or hydronephrosis. Bladder is unremarkable. Stomach/Bowel: Stomach is within normal limits. The appendix is surgically absent. No evidence of bowel dilatation. Slightly asymmetric thickening of the rectosigmoid junction is noted (axial CT images 74 through 79, CT series 2). A mild amount of surrounding inflammatory fat stranding is noted. Vascular/Lymphatic:  No significant vascular findings are present. No enlarged abdominal or pelvic lymph nodes. Reproductive: The prostate gland is enlarged (approximately 7.6 cm x 4.0 cm x 3.2 cm) and contains multiple abscesses. The largest measures approximately 4.9 cm x 3.6 cm x 3.8 cm. A moderate amount of surrounding inflammatory fat stranding is also seen. Other: No abdominal wall hernia or abnormality. No abdominopelvic ascites. Musculoskeletal: Degenerative changes seen at the level of L5-S1. IMPRESSION: 1. Marked severity prostatitis with multiple prostatic abscesses. 2. Findings which may represent adjacent rectal sigmoid colitis. 3. Very mild posterior right basilar atelectasis and/or early infiltrate. 4. Degenerative changes at the level of L5-S1. Electronically Signed   By: Virgina Norfolk M.D.   On: 04/14/2021 20:16    Scheduled Meds:  budesonide  2 mL Inhalation BID   Chlorhexidine Gluconate Cloth  6 each Topical Daily   docusate sodium  100 mg Oral BID   insulin aspart  0-15 Units Subcutaneous Q4H   montelukast  10 mg Oral QHS   polyethylene glycol  17 g Oral Daily   tamsulosin  0.4 mg Oral QPC supper   Continuous Infusions:  ciprofloxacin Stopped (04/16/21 0118)   metronidazole 500 mg (04/16/21 1146)     LOS: 1 day     Barton Dubois, MD [facility]   To contact the attending provider between 7A-7P or the covering provider during after hours 7P-7A, please log into the web site [email or web address] and access using universal [place] password for that web site. If you do not have the password, please call the hospital operator.  04/16/2021, 1:49 PM

## 2021-04-16 NOTE — Progress Notes (Addendum)
Inpatient Diabetes Program Recommendations  AACE/ADA: New Consensus Statement on Inpatient Glycemic Control Target Ranges:  Prepandial:   less than 140 mg/dL      Peak postprandial:   less than 180 mg/dL (1-2 hours)      Critically ill patients:  140 - 180 mg/dL    Latest Reference Range & Units 04/15/21 08:53 04/15/21 13:46 04/15/21 20:27 04/15/21 23:49 04/16/21 04:03  Glucose-Capillary 70 - 99 mg/dL 115 (H) 93 255 (H) 209 (H) 145 (H)    Latest Reference Range & Units 04/15/21 02:22  Hemoglobin A1C 4.8 - 5.6 % 10.4 (H)   Review of Glycemic Control  Diabetes history: DM2 Outpatient Diabetes medications: Raymond Sanchez 32 units daily, Raymond Sanchez 25 mg daily Current orders for Inpatient glycemic control: Novolog 0-15 units Q4H  Inpatient Diabetes Program Recommendations:    HbgA1C:  A1C 10.4% on 04/15/21 indicating an average glucose of 252 mg/dl over the past 2-3 months.  NOTE: In reviewing chart, noted patient received Decadron 4 mg at 15:09 on 04/15/21 which is contributing to hyperglycemia. Spoke with patient about diabetes and home regimen for diabetes control. Patient reports being followed by PCP for diabetes management and currently taking Raymond Sanchez 25 mg daily and Raymond Sanchez daily at night as an outpatient for diabetes control.  Patient states that he adjust the dose of Raymond Sanchez he takes (PCP is aware and offers guidance with adjustments). Patient states that depending on his glucose determines how much Raymond Sanchez to take. Patient states that sometimes he may not take any if glucose is running on low side. Patient reports that if he takes the full dose of Raymond Sanchez 32 units he has some lows during the night which is why he adjust the dose.   Discussed A1C results (10.4% on 04/15/21) and explained that current A1C indicates an average glucose of 252 mg/dl over the past 2-3 months. Discussed glucose and A1C goals. Discussed importance of checking CBGs and maintaining good CBG control to prevent long-term and  short-term complications. Explained how hyperglycemia leads to damage within blood vessels which lead to the common complications seen with uncontrolled diabetes. Stressed to the patient the importance of improving glycemic control to prevent further complications from uncontrolled diabetes. Discussed impact of nutrition, exercise, stress, sickness, and medications on diabetes control.  Patient states that he has thought about talking with PCP about Raymond Sanchez CGM sensors. Discussed Raymond Sanchez CGM sensors and informed patient that our team has samples and I could provide if Dr. Dyann Kief will provide order for samples (order given by Dr. Dyann Kief).  . Patient states that Dr. Alyson Ingles told him that with his current issues he should not be taking Raymond Sanchez.  Stressed to patient to take Raymond Sanchez consistently and if glucose is consistently over 150 mg/dl then reach out to PCP for advice on adjusting insulin dosage. Patient verbalized understanding of information discussed and reports no further questions at this time related to diabetes.  Addendum 04/16/21@15 :15- Provided patient with Raymond Sanchez CGM sensors. Will not assist to apply sensor at this time in case patient needs additional radiological scans during hospitalization (sensor would have to be removed).  Education done regarding application and changing CGM sensor (alternate every 14 days on back of arms), 1 hour warm-up, downloading Raymond Sanchez app, how to scan CGM for glucose reading using app, and information for PCP.  Patient given educational packet regarding use CGM sensor.  Encouraged patient to watch youtube video on applying Raymond Sanchez.  Patient states his brother uses the [employer] so  he can assist him application if needed. Patient verbalizes understanding of use of Raymond Libre CGM and was told that any issues with blood sugars/diabetes will need to be addressed by PCP.    Thanks, Barnie Alderman, RN, MSN, CDE Diabetes  Coordinator Inpatient Diabetes Program [phone] (Team Pager from 8am to 5pm)

## 2021-04-17 ENCOUNTER — Other Ambulatory Visit: Payer: Self-pay | Admitting: Urology

## 2021-04-17 DIAGNOSIS — K529 Noninfective gastroenteritis and colitis, unspecified: Secondary | ICD-10-CM | POA: Diagnosis not present

## 2021-04-17 DIAGNOSIS — N410 Acute prostatitis: Secondary | ICD-10-CM

## 2021-04-17 DIAGNOSIS — E1165 Type 2 diabetes mellitus with hyperglycemia: Secondary | ICD-10-CM | POA: Diagnosis not present

## 2021-04-17 DIAGNOSIS — N412 Abscess of prostate: Secondary | ICD-10-CM | POA: Diagnosis not present

## 2021-04-17 DIAGNOSIS — J4520 Mild intermittent asthma, uncomplicated: Secondary | ICD-10-CM | POA: Diagnosis not present

## 2021-04-17 LAB — CBC
HCT: 38.3 % — ABNORMAL LOW (ref 39.0–52.0)
Hemoglobin: 13 g/dL (ref 13.0–17.0)
MCH: 30.3 pg (ref 26.0–34.0)
MCHC: 33.9 g/dL (ref 30.0–36.0)
MCV: 89.3 fL (ref 80.0–100.0)
Platelets: 325 10*3/uL (ref 150–400)
RBC: 4.29 MIL/uL (ref 4.22–5.81)
RDW: 13.2 % (ref 11.5–15.5)
WBC: 9.4 10*3/uL (ref 4.0–10.5)
nRBC: 0 % (ref 0.0–0.2)

## 2021-04-17 LAB — BASIC METABOLIC PANEL
Anion gap: 8 (ref 5–15)
BUN: 12 mg/dL (ref 6–20)
CO2: 30 mmol/L (ref 22–32)
Calcium: 8.5 mg/dL — ABNORMAL LOW (ref 8.9–10.3)
Chloride: 101 mmol/L (ref 98–111)
Creatinine, Ser: 1 mg/dL (ref 0.61–1.24)
GFR, Estimated: >60 mL/min (ref >60)
Glucose, Bld: 178 mg/dL — ABNORMAL HIGH (ref 70–99)
Potassium: 3.5 mmol/L (ref 3.5–5.1)
Sodium: 139 mmol/L (ref 135–145)

## 2021-04-17 LAB — GLUCOSE, CAPILLARY
Glucose-Capillary: 173 mg/dL — ABNORMAL HIGH (ref 70–99)
Glucose-Capillary: 180 mg/dL — ABNORMAL HIGH (ref 70–99)
Glucose-Capillary: 211 mg/dL — ABNORMAL HIGH (ref 70–99)
Glucose-Capillary: 231 mg/dL — ABNORMAL HIGH (ref 70–99)

## 2021-04-17 LAB — SURGICAL PATHOLOGY

## 2021-04-17 MED ORDER — POLYETHYLENE GLYCOL 3350 17 G PO PACK: 17.0000 g | PACK | Freq: Every day | ORAL | 0 refills | Status: DC

## 2021-04-17 MED ORDER — GLYBURIDE-METFORMIN 1.25-250 MG PO TABS: 2.0000 | ORAL_TABLET | Freq: Every day | ORAL | 1 refills | Status: DC

## 2021-04-17 MED ORDER — SULFAMETHOXAZOLE-TRIMETHOPRIM 800-160 MG PO TABS: 1.0000 | ORAL_TABLET | Freq: Two times a day (BID) | ORAL | 0 refills | Status: AC

## 2021-04-17 MED ORDER — ONDANSETRON 8 MG PO TBDP
8.0000 mg | ORAL_TABLET | Freq: Three times a day (TID) | ORAL | 0 refills | Status: AC | PRN
Start: 2021-04-17 — End: ?

## 2021-04-17 MED ORDER — OXYCODONE HCL 5 MG PO TABS
5.0000 mg | ORAL_TABLET | ORAL | 0 refills | Status: DC | PRN
Start: 2021-04-17 — End: 2021-04-29

## 2021-04-17 MED ORDER — DOCUSATE SODIUM 100 MG PO CAPS: 100.0000 mg | ORAL_CAPSULE | Freq: Two times a day (BID) | ORAL | 0 refills | Status: AC

## 2021-04-17 NOTE — Progress Notes (Signed)
Inpatient Diabetes Program Recommendations  AACE/ADA: New Consensus Statement on Inpatient Glycemic Control   Target Ranges:  Prepandial:   less than 140 mg/dL      Peak postprandial:   less than 180 mg/dL (1-2 hours)      Critically ill patients:  140 - 180 mg/dL    Latest Reference Range & Units 04/17/21 00:33 04/17/21 04:47 04/17/21 07:30  Glucose-Capillary 70 - 99 mg/dL 947 (H) 654 (H) 650 (H)    Latest Reference Range & Units 04/16/21 07:58 04/16/21 11:06 04/16/21 16:06 04/16/21 20:03  Glucose-Capillary 70 - 99 mg/dL 354 (H) 656 (H) 812 (H) 218 (H)   Review of Glycemic Control  Diabetes history: DM2 Outpatient Diabetes medications: Tresiba 32 units daily, Jardiance 25 mg daily Current orders for Inpatient glycemic control: Novolog 0-15 units Q4H   Inpatient Diabetes Program Recommendations:     Insulin: Please consider ordering Semglee 10 units Q24H,  Novolog 3 units TID with meals for meal coverage if patient eats at least 50% of meals, and changing CBGs and Novolog 0-15 units to AC&HS.  Thanks, Orlando Penner, RN, MSN, CDE Diabetes Coordinator Inpatient Diabetes Program [phone] (Team Pager from 8am to 5pm)

## 2021-04-17 NOTE — Progress Notes (Signed)
Pt discharged to home. DC instructions given with son at bedside. No concerns voiced. Pt left unit in wheelchair pushed by nurse tech. Left in stable condition.

## 2021-04-17 NOTE — Progress Notes (Signed)
2 Days Post-Op Subjective: Patient reports resolved perineal pain. Urine clear. No fevers Objective: Vital signs in last 24 hours: Temp:  [97.8 F (36.6 C)-98.7 F (37.1 C)] 97.8 F (36.6 C) (01/06 0555) Pulse Rate:  [66-89] 66 (01/06 0555) Resp:  [17-18] 17 (01/06 0555) BP: (120-126)/(69-75) 126/75 (01/06 0555) SpO2:  [95 %-99 %] 99 % (01/06 0555)  Intake/Output from previous day: 01/05 0701 - 01/06 0700 In: 1240 [P.O.:940; IV Piggyback:300] Out: 2975 [Urine:2975] Intake/Output this shift: No intake/output data recorded.  Physical Exam:  General:alert, cooperative, and appears stated age GI: soft, non tender, normal bowel sounds, no palpable masses, no organomegaly, no inguinal hernia Male genitalia: not done Extremities: extremities normal, atraumatic, no cyanosis or edema  Lab Results: Recent Labs    04/14/21 1823 04/15/21 0222 04/17/21 0659  HGB 14.5 13.7 13.0  HCT 43.1 39.9 38.3*   BMET Recent Labs    04/15/21 0222 04/17/21 0659  NA 132* 139  K 3.7 3.5  CL 98 101  CO2 23 30  GLUCOSE 150* 178*  BUN 14 12  CREATININE 0.67 1.00  CALCIUM 8.4* 8.5*   No results for input(s): LABPT, INR in the last 72 hours. No results for input(s): LABURIN in the last 72 hours. Results for orders placed or performed during the hospital encounter of 04/14/21  Resp Panel by RT-PCR (Flu A&B, Covid) Nasopharyngeal Swab     Status: None   Collection Time: 04/15/21  1:58 AM   Specimen: Nasopharyngeal Swab; Nasopharyngeal(NP) swabs in vial transport medium  Result Value Ref Range Status   SARS Coronavirus 2 by RT PCR NEGATIVE NEGATIVE Final    Comment: (NOTE) SARS-CoV-2 target nucleic acids are NOT DETECTED.  The SARS-CoV-2 RNA is generally detectable in upper respiratory specimens during the acute phase of infection. The lowest concentration of SARS-CoV-2 viral copies this assay can detect is 138 copies/mL. A negative result does not preclude SARS-Cov-2 infection and should  not be used as the sole basis for treatment or other patient management decisions. A negative result may occur with  improper specimen collection/handling, submission of specimen other than nasopharyngeal swab, presence of viral mutation(s) within the areas targeted by this assay, and inadequate number of viral copies(<138 copies/mL). A negative result must be combined with clinical observations, patient history, and epidemiological information. The expected result is Negative.  Fact Sheet for Patients:  EntrepreneurPulse.com.au  Fact Sheet for Healthcare Providers:  IncredibleEmployment.be  This test is no t yet approved or cleared by the [place] FDA and  has been authorized for detection and/or diagnosis of SARS-CoV-2 by FDA under an Emergency Use Authorization (EUA). This EUA will remain  in effect (meaning this test can be used) for the duration of the COVID-19 declaration under Section 564(b)(1) of the Act, 21 U.S.C.section 360bbb-3(b)(1), unless the authorization is terminated  or revoked sooner.       Influenza A by PCR NEGATIVE NEGATIVE Final   Influenza B by PCR NEGATIVE NEGATIVE Final    Comment: (NOTE) The Xpert Xpress SARS-CoV-2/FLU/RSV plus assay is intended as an aid in the diagnosis of influenza from Nasopharyngeal swab specimens and should not be used as a sole basis for treatment. Nasal washings and aspirates are unacceptable for Xpert Xpress SARS-CoV-2/FLU/RSV testing.  Fact Sheet for Patients: EntrepreneurPulse.com.au  Fact Sheet for Healthcare Providers: IncredibleEmployment.be  This test is not yet approved or cleared by the [place] FDA and has been authorized for detection and/or diagnosis of SARS-CoV-2 by FDA under an Emergency Use  Authorization (EUA). This EUA will remain in effect (meaning this test can be used) for the duration of the COVID-19 declaration under  Section 564(b)(1) of the Act, 21 U.S.C. section 360bbb-3(b)(1), unless the authorization is terminated or revoked.  Performed at [facility], [address], [place], [place] [address]   Culture, blood (routine x 2)     Status: None (Preliminary result)   Collection Time: 04/15/21  2:22 AM   Specimen: BLOOD LEFT HAND  Result Value Ref Range Status   Specimen Description BLOOD LEFT HAND  Final   Special Requests   Final    BOTTLES DRAWN AEROBIC AND ANAEROBIC Blood Culture results may not be optimal due to an excessive volume of blood received in culture bottles   Culture   Final    NO GROWTH 2 DAYS Performed at [facility], [address] [place], [place] [address]    Report Status PENDING  Incomplete  Culture, blood (routine x 2)     Status: None (Preliminary result)   Collection Time: 04/15/21  2:22 AM   Specimen: Left Antecubital; Blood  Result Value Ref Range Status   Specimen Description LEFT ANTECUBITAL  Final   Special Requests   Final    BOTTLES DRAWN AEROBIC AND ANAEROBIC Blood Culture results may not be optimal due to an excessive volume of blood received in culture bottles   Culture   Final    NO GROWTH 2 DAYS Performed at [facility], [address] [place], [place] [address]    Report Status PENDING  Incomplete  Surgical pcr screen     Status: Abnormal   Collection Time: 04/15/21  1:05 PM   Specimen: Nasal Mucosa; Nasal Swab  Result Value Ref Range Status   MRSA, PCR NEGATIVE NEGATIVE Final   Staphylococcus aureus POSITIVE (A) NEGATIVE Final    Comment: CRITICAL RESULT CALLED TO, READ BACK BY AND VERIFIED WITH: BLACKWELL,S AT 1814 ON 1.4.23 BY ISLEY,B (NOTE) The Xpert SA Assay (FDA approved for NASAL specimens in patients 24 years of age and older), is one component of a comprehensive surveillance program. It is not intended to diagnose infection nor to guide or monitor treatment. Performed at [facility], [address] [place], [place] [address]      Studies/Results: No results found.  Assessment/Plan: POD#2  Prostatic abscess drainage  Patient can be discharge from urology standpoint. He will followup Wednesday for foley catheter removal. He can continue bacrim DS BID as an outpatient   LOS: 2 days   Nicolette Bang 04/17/2021, 11:08 AM

## 2021-04-17 NOTE — Discharge Summary (Signed)
Physician Discharge Summary  Raymond Sanchez RAQ:[id number] DOB: 1976/02/14 DOA: 04/14/2021  PCP: Lovey Newcomer, PA  Admit date: 04/14/2021 Discharge date: 04/17/2021  Time spent: 35 minutes  Recommendations for Outpatient Follow-up:  Repeat basic metabolic panel to follow electrolytes and renal function. Make sure patient has follow-up with urology service as instructed. Reassess CBGs/A1c with further adjustment to hypoglycemia regimen. (Jardiance d/c given acute infection and prostate abscess as per urology recommendations).    Discharge Diagnoses:  Principal Problem:   Prostatic abscess Active Problems:   Prostatitis   Colitis   Asthma   Hyperglycemia due to diabetes mellitus (HCC)   Prostate abscess   Discharge Condition: Stable and improved.  Discharged home with instruction to follow-up with PCP and neurology service.  CODE STATUS: Full code.  Diet recommendation: Modified carbohydrate diet.  Filed Weights   04/14/21 1754  Weight: 112 kg    History of present illness:  As per H&P written by Dr. Thomes Dinning on 04/15/2021 Raymond Sanchez is a 46 y.o. male with medical history significant for hypertension, T2DM, asthma who presents to the emergency department due to 10 day onset of groin pain which was rated as 8-9/10, he also complained of pressure on urination with decreased urine flow.  He followed up with his PCP who ordered testicular ultrasound due to concern of possible kidney stone.  Patient follows with a urologist about 5 days ago and he was diagnosed to have prostatitis, IV ceftriaxone was given, Bactrim was prescribed, he was told to follow-up if symptoms do not improve within 2 to 3 days, since there was no improvement in symptom, ED contacted his urologist we will have him to go to the ED to get a CT scan done.  He denies chest pain, shortness of breath, fever, nausea, vomiting or abdominal pain.   ED Course:  In the emergency department, he was hemodynamically stable.  Work-up in the ED showed normal CBC and BMP except for hyperglycemia.   CT abdomen and pelvis with contrast showed marked severity prostatitis with multiple prostatic abscesses.  Findings which may represent adjacent rectosigmoid colitis.  Plan patient was started on IV ciprofloxacin and metronidazole. Urologist on-call (Dr. Berneice Heinrich) was consulted and recommended transferring patient to [facility] for surgical management per ED physician.  Hospitalist was asked to admit patient for further evaluation and management.  Hospital Course:  1-Prostatitis and prostatic abscess -Status post prostate abscess drainage by urology service on 04/15/2021 -Discharged on oral Bactrim for 10 more days as recommended by urology service. -Currently with Foley catheter in place; planning to follow-up 04/22/2021 with urology for voiding trial and removal. -Patient reports improvement/resolution of his perineum pain -Afebrile -WBCs trending down. -Advised to maintain adequate hydration.   2-Colitis/constipation -Probably in the setting of 1 -Continue antibiotic as mentioned above.  -Continue as needed laxative regimen has been added. -Maintain adequate hydration.   3-Asthma -Mild intermittent -No wheezing on exam -Good saturation and no requiring supplementation. -Resume home bronchodilator management.   4-Hyperglycemia due to diabetes mellitus (HCC) -Continue sliding scale insulin -Follow CBGs and adjust regimen as needed -Infection may be contributing to elevated blood sugar. -Will follow recommendations by diabetes coordinator service.   5-BPH -Currently with Foley in place after surgical intervention; per urology plan we will keep Foley for 5 days; follow-up as an outpatient for voiding trial and removal. -Continue Flomax.  Procedures: Incision and drainage of prostatic abscess on 04/15/2021  Consultations: Urology service  Discharge Exam: Vitals:  04/16/21 2145 04/17/21 0555   BP: 120/69 126/75  Pulse: 89 66  Resp: 18 17  Temp: 98.7 F (37.1 C) 97.8 F (36.6 C)  SpO2: 95% 99%   General exam: Appears calm and comfortable; no fever overnight; reported resolution of his perineum pain.  Patient denies nausea or vomiting. Respiratory system: Clear to auscultation.  Good air movement bilaterally; no using accessory muscle.  Good saturation on room air. Cardiovascular system: S1 & S2 heard, RRR. No JVD, murmurs, rubs, gallops or clicks. No pedal edema. Gastrointestinal system: Abdomen is nondistended, soft and without guarding; positive bowel sounds appreciated.  Central nervous system: Alert and oriented. No focal neurological deficits. Extremities: No cyanosis, no clubbing, no edema. Skin: No petechiae.  Catheter in place. Psychiatry: Judgement and insight appear normal. Mood & affect appropriate.     Discharge Instructions   Discharge Instructions     Diet Carb Modified   Complete by: As directed    Discharge instructions   Complete by: As directed    Take medications as prescribed Maintain adequate hydration Follow heart healthy/modified carbohydrate diet Provide adequate Foley catheter care as instructed by urology Follow-up on 10/20/2021 with urology service as instructed Arrange follow-up with PCP in 10-14 days.   No wound care   Complete by: As directed       Allergies as of 04/17/2021   No Known Allergies      Medication List     STOP taking these medications    Jardiance 25 MG Tabs tablet Generic drug: empagliflozin   triamcinolone 0.1 % cream : eucerin Crea   triamcinolone ointment 0.1 % Commonly known as: KENALOG       TAKE these medications    albuterol 108 (90 Base) MCG/ACT inhaler Commonly known as: VENTOLIN HFA Inhale 1-2 puffs into the lungs every 6 (six) hours as needed for wheezing or shortness of breath.   docusate sodium 100 MG capsule Commonly known as: COLACE Take 1 capsule (100 mg total) by mouth 2 (two)  times daily.   glyBURIDE-metformin 1.25-250 MG tablet Commonly known as: GLUCOVANCE Take 2 tablets by mouth daily with breakfast.   levocetirizine 5 MG tablet Commonly known as: XYZAL Take 1 tablet (5 mg total) by mouth every evening.   montelukast 10 MG tablet Commonly known as: Singulair Take 1 tablet (10 mg total) by mouth at bedtime.   ondansetron 8 MG disintegrating tablet Commonly known as: ZOFRAN-ODT Take 1 tablet (8 mg total) by mouth every 8 (eight) hours as needed for nausea or vomiting.   polyethylene glycol 17 g packet Commonly known as: MIRALAX / GLYCOLAX Take 17 g by mouth daily. Start taking on: April 18, 2021   Qvar RediHaler 80 MCG/ACT inhaler Generic drug: beclomethasone Inhale 2 puffs into the lungs 2 (two) times daily.   sulfamethoxazole-trimethoprim 800-160 MG tablet Commonly known as: BACTRIM DS Take 1 tablet by mouth 2 (two) times daily.   tamsulosin 0.4 MG Caps capsule Commonly known as: FLOMAX Take 1 capsule (0.4 mg total) by mouth daily after supper.   traMADol 50 MG tablet Commonly known as: ULTRAM Take 50-100 mg by mouth 3 (three) times daily.   Tyler Aas FlexTouch 200 UNIT/ML FlexTouch Pen Generic drug: insulin degludec Inject 32 Units into the skin daily.       No Known Allergies  Follow-up Information     Lavella Lemons, [place]. Schedule an appointment as soon as possible for a visit in 10 day(s).   Specialty: Physician Assistant Contact information:  [place] [address] [phone]                The results of significant diagnostics from this hospitalization (including imaging, microbiology, ancillary and laboratory) are listed below for reference.    Significant Diagnostic Studies: Abdomen 1 view (KUB)  Result Date: 04/11/2021 CLINICAL DATA:  Nephrolithiasis. EXAM: ABDOMEN - 1 VIEW COMPARISON:  None. FINDINGS: Both kidneys are largely obscured by bowel gas. No renal or ureteral stones identified  within this limitation. No other abnormalities. IMPRESSION: Limited study as both kidneys are obscured by bowel gas. No renal or ureteral stones noted. Electronically Signed   By: Dorise Bullion III M.D.   On: 04/11/2021 11:11   CT ABDOMEN PELVIS W CONTRAST  Result Date: 04/14/2021 CLINICAL DATA:  Worsening groin pain. EXAM: CT ABDOMEN AND PELVIS WITH CONTRAST TECHNIQUE: Multidetector CT imaging of the abdomen and pelvis was performed using the standard protocol following bolus administration of intravenous contrast. CONTRAST:  150mL OMNIPAQUE IOHEXOL 300 MG/ML  SOLN COMPARISON:  September 21, 2017 FINDINGS: Lower chest: Very mild posterior right basilar atelectasis and/or early infiltrate is seen. Hepatobiliary: No focal liver abnormality is seen. Status post cholecystectomy. No biliary dilatation. Pancreas: Unremarkable. No pancreatic ductal dilatation or surrounding inflammatory changes. Spleen: Normal in size without focal abnormality. Adrenals/Urinary Tract: Adrenal glands are unremarkable. Kidneys are normal, without renal calculi, focal lesion, or hydronephrosis. Bladder is unremarkable. Stomach/Bowel: Stomach is within normal limits. The appendix is surgically absent. No evidence of bowel dilatation. Slightly asymmetric thickening of the rectosigmoid junction is noted (axial CT images 74 through 79, CT series 2). A mild amount of surrounding inflammatory fat stranding is noted. Vascular/Lymphatic: No significant vascular findings are present. No enlarged abdominal or pelvic lymph nodes. Reproductive: The prostate gland is enlarged (approximately 7.6 cm x 4.0 cm x 3.2 cm) and contains multiple abscesses. The largest measures approximately 4.9 cm x 3.6 cm x 3.8 cm. A moderate amount of surrounding inflammatory fat stranding is also seen. Other: No abdominal wall hernia or abnormality. No abdominopelvic ascites. Musculoskeletal: Degenerative changes seen at the level of L5-S1. IMPRESSION: 1. Marked severity  prostatitis with multiple prostatic abscesses. 2. Findings which may represent adjacent rectal sigmoid colitis. 3. Very mild posterior right basilar atelectasis and/or early infiltrate. 4. Degenerative changes at the level of L5-S1. Electronically Signed   By: Virgina Norfolk M.D.   On: 04/14/2021 20:16   US SCROTUM W/DOPPLER  Result Date: 04/08/2021 CLINICAL DATA:  Pain in testicle, unspecified laterality/Pain in left testicle EXAM: SCROTAL ULTRASOUND DOPPLER ULTRASOUND OF THE TESTICLES TECHNIQUE: Complete ultrasound examination of the testicles, epididymis, and other scrotal structures was performed. Color and spectral Doppler ultrasound were also utilized to evaluate blood flow to the testicles. COMPARISON:  None. FINDINGS: Right testicle Measurements: 4.8 x 2.3 x 3.5 cm. No mass or microlithiasis visualized. Left testicle Measurements: 4.4 x 1.9 x 2.4 cm. No mass or microlithiasis visualized. Right epididymis:  Normal in size and appearance. Left epididymis:  Normal in size and appearance. Hydrocele:  Trace hydroceles bilaterally. Varicocele:  Left-sided varicocele measuring up to 4 mm. Pulsed Doppler interrogation of both testes demonstrates normal low resistance arterial and venous waveforms bilaterally. IMPRESSION: Left-sided varicocele.  Trace bilateral hydroceles. Normal testicles and epididymi. Electronically Signed   By: Maurine Simmering M.D.   On: 04/08/2021 08:52    Microbiology: Recent Results (from the past 240 hour(s))  Microscopic Examination     Status: Abnormal   Collection Time: 04/10/21 12:43 PM  Urine  Result Value Ref Range Status   WBC, UA 6-10 (A) 0 - 5 /hpf Final   RBC 11-30 (A) 0 - 2 /hpf Final   Epithelial Cells (non renal) 0-10 0 - 10 /hpf Final   Renal Epithel, UA None seen None seen /hpf Final   Bacteria, UA Few None seen/Few Final  Urine Culture     Status: None   Collection Time: 04/10/21  1:35 PM   Specimen: Urine   Urine  Result Value Ref Range Status   Urine  Culture, Routine Final report  Final   Organism ID, Bacteria No growth  Final  Resp Panel by RT-PCR (Flu A&B, Covid) Nasopharyngeal Swab     Status: None   Collection Time: 04/15/21  1:58 AM   Specimen: Nasopharyngeal Swab; Nasopharyngeal(NP) swabs in vial transport medium  Result Value Ref Range Status   SARS Coronavirus 2 by RT PCR NEGATIVE NEGATIVE Final    Comment: (NOTE) SARS-CoV-2 target nucleic acids are NOT DETECTED.  The SARS-CoV-2 RNA is generally detectable in upper respiratory specimens during the acute phase of infection. The lowest concentration of SARS-CoV-2 viral copies this assay can detect is 138 copies/mL. A negative result does not preclude SARS-Cov-2 infection and should not be used as the sole basis for treatment or other patient management decisions. A negative result may occur with  improper specimen collection/handling, submission of specimen other than nasopharyngeal swab, presence of viral mutation(s) within the areas targeted by this assay, and inadequate number of viral copies(<138 copies/mL). A negative result must be combined with clinical observations, patient history, and epidemiological information. The expected result is Negative.  Fact Sheet for Patients:  EntrepreneurPulse.com.au  Fact Sheet for Healthcare Providers:  IncredibleEmployment.be  This test is no t yet approved or cleared by the [place] FDA and  has been authorized for detection and/or diagnosis of SARS-CoV-2 by FDA under an Emergency Use Authorization (EUA). This EUA will remain  in effect (meaning this test can be used) for the duration of the COVID-19 declaration under Section 564(b)(1) of the Act, 21 U.S.C.section 360bbb-3(b)(1), unless the authorization is terminated  or revoked sooner.       Influenza A by PCR NEGATIVE NEGATIVE Final   Influenza B by PCR NEGATIVE NEGATIVE Final    Comment: (NOTE) The Xpert Xpress  SARS-CoV-2/FLU/RSV plus assay is intended as an aid in the diagnosis of influenza from Nasopharyngeal swab specimens and should not be used as a sole basis for treatment. Nasal washings and aspirates are unacceptable for Xpert Xpress SARS-CoV-2/FLU/RSV testing.  Fact Sheet for Patients: EntrepreneurPulse.com.au  Fact Sheet for Healthcare Providers: IncredibleEmployment.be  This test is not yet approved or cleared by the [place] FDA and has been authorized for detection and/or diagnosis of SARS-CoV-2 by FDA under an Emergency Use Authorization (EUA). This EUA will remain in effect (meaning this test can be used) for the duration of the COVID-19 declaration under Section 564(b)(1) of the Act, 21 U.S.C. section 360bbb-3(b)(1), unless the authorization is terminated or revoked.  Performed at [facility], [address], [place], [place] [address]   Culture, blood (routine x 2)     Status: None (Preliminary result)   Collection Time: 04/15/21  2:22 AM   Specimen: BLOOD LEFT HAND  Result Value Ref Range Status   Specimen Description BLOOD LEFT HAND  Final   Special Requests   Final    BOTTLES DRAWN AEROBIC AND ANAEROBIC Blood Culture results may not be optimal due to an  excessive volume of blood received in culture bottles   Culture   Final    NO GROWTH 2 DAYS Performed at [facility], [address], [place], [place] [address]    Report Status PENDING  Incomplete  Culture, blood (routine x 2)     Status: None (Preliminary result)   Collection Time: 04/15/21  2:22 AM   Specimen: Left Antecubital; Blood  Result Value Ref Range Status   Specimen Description LEFT ANTECUBITAL  Final   Special Requests   Final    BOTTLES DRAWN AEROBIC AND ANAEROBIC Blood Culture results may not be optimal due to an excessive volume of blood received in culture bottles   Culture   Final    NO GROWTH 2 DAYS Performed at [facility], [address]  [place], [place] [address]    Report Status PENDING  Incomplete  Surgical pcr screen     Status: Abnormal   Collection Time: 04/15/21  1:05 PM   Specimen: Nasal Mucosa; Nasal Swab  Result Value Ref Range Status   MRSA, PCR NEGATIVE NEGATIVE Final   Staphylococcus aureus POSITIVE (A) NEGATIVE Final    Comment: CRITICAL RESULT CALLED TO, READ BACK BY AND VERIFIED WITH: BLACKWELL,S AT 1814 ON 1.4.23 BY ISLEY,B (NOTE) The Xpert SA Assay (FDA approved for NASAL specimens in patients 3 years of age and older), is one component of a comprehensive surveillance program. It is not intended to diagnose infection nor to guide or monitor treatment. Performed at [facility], [address], [place], [place] [address]      Labs: Basic Metabolic Panel: Recent Labs  Lab 04/14/21 1823 04/15/21 0222 04/17/21 0659  NA 136 132* 139  K 4.8 3.7 3.5  CL 98 98 101  CO2 26 23 30   GLUCOSE 172* 150* 178*  BUN 14 14 12   CREATININE 0.79 0.67 1.00  CALCIUM 9.3 8.4* 8.5*  MG  --  2.1  --   PHOS  --  3.4  --    Liver Function Tests: Recent Labs  Lab 04/15/21 0222  AST 14*  ALT 13  ALKPHOS 75  BILITOT 0.6  PROT 7.3  ALBUMIN 3.4*    CBC: Recent Labs  Lab 04/14/21 1823 04/15/21 0222 04/17/21 0659  WBC 9.8 10.0 9.4  NEUTROABS 7.1  --   --   HGB 14.5 13.7 13.0  HCT 43.1 39.9 38.3*  MCV 88.7 87.7 89.3  PLT 324 346 325   CBG: Recent Labs  Lab 04/16/21 2003 04/17/21 0033 04/17/21 0447 04/17/21 0730 04/17/21 1129  GLUCAP 218* 211* 180* 173* 231*    Signed:  Barton Dubois MD.  [facility] 04/17/2021, 1:06 PM

## 2021-04-21 LAB — CULTURE, BLOOD (ROUTINE X 2)
Culture: NO GROWTH
Culture: NO GROWTH

## 2021-04-22 ENCOUNTER — Other Ambulatory Visit: Payer: Self-pay

## 2021-04-22 ENCOUNTER — Ambulatory Visit (INDEPENDENT_AMBULATORY_CARE_PROVIDER_SITE_OTHER): Payer: Managed Care, Other (non HMO)

## 2021-04-22 DIAGNOSIS — N410 Acute prostatitis: Secondary | ICD-10-CM

## 2021-04-22 NOTE — Progress Notes (Signed)
Fill and Pull Catheter Removal  Patient is present today for a catheter removal.  Patient was cleaned and prepped in a sterile fashion 355ml of sterile water/ saline was instilled into the bladder when the patient felt the urge to urinate. 57ml of water was then drained from the balloon.  A 22FR foley cath was removed from the bladder no complications were noted .  Patient as then given some time to void on their own.  Patient can void  335ml on their own after some time.  Patient tolerated well.  Performed by: Estill Bamberg RN   Follow up/ Additional notes: will f/u with post op with PA

## 2021-04-23 ENCOUNTER — Ambulatory Visit: Payer: Managed Care, Other (non HMO) | Admitting: Physician Assistant

## 2021-04-29 ENCOUNTER — Encounter: Payer: Self-pay | Admitting: Physician Assistant

## 2021-04-29 ENCOUNTER — Other Ambulatory Visit: Payer: Self-pay

## 2021-04-29 ENCOUNTER — Ambulatory Visit: Payer: Managed Care, Other (non HMO) | Admitting: Physician Assistant

## 2021-04-29 VITALS — BP 119/78 | HR 91 | Ht 78.0 in | Wt 250.0 lb

## 2021-04-29 DIAGNOSIS — N410 Acute prostatitis: Secondary | ICD-10-CM

## 2021-04-29 DIAGNOSIS — N200 Calculus of kidney: Secondary | ICD-10-CM

## 2021-04-29 DIAGNOSIS — R310 Gross hematuria: Secondary | ICD-10-CM

## 2021-04-29 DIAGNOSIS — N412 Abscess of prostate: Secondary | ICD-10-CM

## 2021-04-29 LAB — MICROSCOPIC EXAMINATION
Bacteria, UA: NONE SEEN
Epithelial Cells (non renal): NONE SEEN /hpf (ref 0–10)
RBC, Urine: >30 /hpf — AB (ref 0–2)
Renal Epithel, UA: NONE SEEN /hpf

## 2021-04-29 LAB — URINALYSIS, ROUTINE W REFLEX MICROSCOPIC
Bilirubin, UA: NEGATIVE
Ketones, UA: NEGATIVE
Nitrite, UA: NEGATIVE
Specific Gravity, UA: >1.03 — ABNORMAL HIGH (ref 1.005–1.030)
Urobilinogen, Ur: 0.2 mg/dL (ref 0.2–1.0)
pH, UA: 6 (ref 5.0–7.5)

## 2021-04-29 LAB — BLADDER SCAN AMB NON-IMAGING

## 2021-04-29 NOTE — Progress Notes (Signed)
Urological Symptom Review  Patient is experiencing the following symptoms:  Hard to postpone urination Burning/pain with urination Leakage of urine Blood in urine  Review of Systems  Gastrointestinal (upper)  : Negative for upper GI symptoms  Gastrointestinal (lower) : Negative for lower GI symptoms  Constitutional : Negative for symptoms  Skin: Negative for skin symptoms  Eyes: Negative for eye symptoms  Ear/Nose/Throat : Negative for Ear/Nose/Throat symptoms  Hematologic/Lymphatic: Negative for Hematologic/Lymphatic symptoms  Cardiovascular : Negative for cardiovascular symptoms  Respiratory : Negative for respiratory symptoms  Endocrine: Negative for endocrine symptoms  Musculoskeletal: Negative for musculoskeletal symptoms  Neurological: Negative for neurological symptoms  Psychologic: Negative for psychiatric symptoms

## 2021-04-29 NOTE — Progress Notes (Signed)
04/29/2021 3:40 PM   Raymond Sanchez 11/28/1975 [id number]  Referring provider: Lavella Lemons, PA [place],  [place] [address]  No chief complaint on file. Post op visit  HPI: The pt is a 46YO male presenting for post op evaluation following drainage of prostate abscess and TURP on 04/15/21. Pt continues to have some urgency, burning and gross hematuria, but no fever, chills, nausea, or abdominal pain. Pt has returned to work delivering fuel and reports significant pain by the end of the day. He remains on Bactrim DS BID and tamsulosin since hospital DC.  PVR=45ml UA=6-10 WBC, >30 RBC, no bacteria Culture ordered  PMH: Past Medical History:  Diagnosis Date   Diabetes mellitus without complication ([place])    Flexural atopic dermatitis 08/05/2019   Hypertension    Mild intermittent asthma, uncomplicated [id number]    Surgical History: Past Surgical History:  Procedure Laterality Date   APPENDECTOMY     ARTHROSCOPIC REPAIR ACL Right 2001   CHOLECYSTECTOMY     CYSTOSCOPY N/A 04/15/2021   Procedure: CYSTOSCOPY;  Surgeon: Cleon Gustin, MD;  Location: [facility];  Service: Urology;  Laterality: N/A;   FOOT SURGERY     KNEE ARTHROSCOPY     TRANSURETHRAL RESECTION OF PROSTATE N/A 04/15/2021   Procedure: UNROOFING PROSTATIC ABSCESS;  Surgeon: Cleon Gustin, MD;  Location: [facility];  Service: Urology;  Laterality: N/A;    Home Medications:  Allergies as of 04/29/2021   No Known Allergies      Medication List        Accurate as of April 29, 2021  3:40 PM. If you have any questions, ask your nurse or doctor.          STOP taking these medications    glyBURIDE-metformin 1.25-250 MG tablet Commonly known as: GLUCOVANCE Stopped by: Summerlin, Berneice Heinrich, PA-C   oxyCODONE 5 MG immediate release tablet Commonly known as: Roxicodone Stopped by: Summerlin, Berneice Heinrich, PA-C   polyethylene glycol 17 g packet Commonly known as: MIRALAX /  GLYCOLAX Stopped by: Summerlin, Berneice Heinrich, PA-C   Qvar RediHaler 80 MCG/ACT inhaler Generic drug: beclomethasone Stopped by: Reynaldo Minium, PA-C   traMADol 50 MG tablet Commonly known as: ULTRAM Stopped by: Summerlin, Berneice Heinrich, PA-C       TAKE these medications    albuterol 108 (90 Base) MCG/ACT inhaler Commonly known as: VENTOLIN HFA Inhale 1-2 puffs into the lungs every 6 (six) hours as needed for wheezing or shortness of breath.   docusate sodium 100 MG capsule Commonly known as: COLACE Take 1 capsule (100 mg total) by mouth 2 (two) times daily.   levocetirizine 5 MG tablet Commonly known as: XYZAL Take 1 tablet (5 mg total) by mouth every evening.   montelukast 10 MG tablet Commonly known as: Singulair Take 1 tablet (10 mg total) by mouth at bedtime.   ondansetron 8 MG disintegrating tablet Commonly known as: ZOFRAN-ODT Take 1 tablet (8 mg total) by mouth every 8 (eight) hours as needed for nausea or vomiting.   sulfamethoxazole-trimethoprim 800-160 MG tablet Commonly known as: BACTRIM DS Take 1 tablet by mouth 2 (two) times daily.   tamsulosin 0.4 MG Caps capsule Commonly known as: FLOMAX Take 1 capsule (0.4 mg total) by mouth daily after supper.   Tyler Aas FlexTouch 200 UNIT/ML FlexTouch Pen Generic drug: insulin degludec Inject 32 Units into the skin daily.        Allergies: No Known Allergies  Family History: Family History  Problem Relation Age of Onset   Allergic rhinitis Father    Angioedema Neg Hx    Asthma Neg Hx    Atopy Neg Hx    Eczema Neg Hx    Immunodeficiency Neg Hx    Urticaria Neg Hx     Social History:  reports that he has quit smoking. His smokeless tobacco use includes snuff. He reports current alcohol use. He reports that he does not use drugs.  ROS: All other review of systems were reviewed and are negative except what is noted above in HPI  Physical Exam: BP 119/78    Pulse 91    Ht 6\' 6"   (1.981 m)    Wt 250 lb (113.4 kg)    BMI 28.89 kg/m   Constitutional:  Alert and oriented, No acute distress. HEENT: [place] AT, moist mucus membranes.  Trachea midline, no masses. Cardiovascular: No clubbing, cyanosis, or edema. Respiratory: Normal respiratory effort, no increased work of breathing. GI: Abdomen is nondistended Lymph: No cervical or inguinal lymphadenopathy. Skin: No rashes, bruises or suspicious lesions. Neurologic: Grossly intact, no focal deficits, moving all 4 extremities. Psychiatric: Normal mood and affect.  Laboratory Data: Lab Results  Component Value Date   WBC 9.4 04/17/2021   HGB 13.0 04/17/2021   HCT 38.3 (L) 04/17/2021   MCV 89.3 04/17/2021   PLT 325 04/17/2021    Lab Results  Component Value Date   CREATININE 1.00 04/17/2021    No results found for: PSA  No results found for: TESTOSTERONE  Lab Results  Component Value Date   HGBA1C 10.4 (H) 04/15/2021    Urinalysis    Component Value Date/Time   COLORURINE YELLOW 04/15/2021 1002   APPEARANCEUR CLEAR 04/15/2021 1002   APPEARANCEUR Clear 04/10/2021 1243   LABSPEC >1.046 (H) 04/15/2021 1002   PHURINE 5.0 04/15/2021 1002   GLUCOSEU >=500 (A) 04/15/2021 1002   HGBUR MODERATE (A) 04/15/2021 1002   BILIRUBINUR NEGATIVE 04/15/2021 1002   BILIRUBINUR Negative 04/10/2021 1243   KETONESUR 80 (A) 04/15/2021 1002   PROTEINUR 30 (A) 04/15/2021 1002   UROBILINOGEN 0.2 08/30/2014 1414   NITRITE NEGATIVE 04/15/2021 1002   LEUKOCYTESUR TRACE (A) 04/15/2021 1002    Lab Results  Component Value Date   LABMICR See below: 04/10/2021   WBCUA 6-10 (A) 04/10/2021   LABEPIT 0-10 04/10/2021   BACTERIA NONE SEEN 04/15/2021    Pertinent Imaging:  Results for orders placed in visit on 04/10/21  Abdomen 1 view (KUB)  Narrative CLINICAL DATA:  Nephrolithiasis.  EXAM: ABDOMEN - 1 VIEW  COMPARISON:  None.  FINDINGS: Both kidneys are largely obscured by bowel gas. No renal or ureteral stones  identified within this limitation. No other abnormalities.  IMPRESSION: Limited study as both kidneys are obscured by bowel gas. No renal or ureteral stones noted.   Electronically Signed By: Dorise Bullion III M.D. On: 04/11/2021 11:11  No results found for this or any previous visit.  No results found for this or any previous visit.  No results found for this or any previous visit.  No results found for this or any previous visit.  No results found for this or any previous visit.  No results found for this or any previous visit.  Results for orders placed during the hospital encounter of 08/30/14  CT RENAL STONE STUDY  Narrative CLINICAL DATA:  Right flank pain starting today, status post appendectomy, status postcholecystectomy.  EXAM: CT ABDOMEN AND PELVIS WITHOUT CONTRAST  TECHNIQUE: Multidetector CT imaging of  the abdomen and pelvis was performed following the standard protocol without IV contrast.  COMPARISON:  None.  FINDINGS: Sagittal images of the spine shows disc space flattening with vacuum disc phenomenon at L5-S1 level. The lung bases are unremarkable.  The patient is status postcholecystectomy. Unenhanced liver shows no biliary ductal dilatation. Unenhanced pancreas, spleen and adrenal glands are unremarkable. Unenhanced kidneys are symmetrical in size. No aortic aneurysm. No nephrolithiasis. No hydronephrosis or hydroureter. No calcified ureteral calculi are noted.  No small bowel obstruction. No ascites or free air. No adenopathy. Moderate stool and gas noted in transverse colon right colon and descending colon.  There is a redundant descending colon and especially sigmoid colon. The sigmoid colon is looping in right mid abdomen moderate distended with gas. There is some narrowing of the lumen of proximal sigmoid colon in axial image 53 and 54 without definite evidence of colonic obstruction. Please note this circular segment of the colon  is surrounding mild rotational pattern of mesenteric vessels as seen in axial image 54. There is no definite evidence of internal hernia or mesenteric edema or fluid. Clinical correlation is necessary. If the patient has persistent colic like abdominal pain follow-up enhanced study could be performed as clinically warranted.  IMPRESSION: 1. There is no nephrolithiasis.  No hydronephrosis or hydroureter. 2. Status post appendectomy. 3. There is redundant sigmoid colon moderate distended with gas. There is circular short segment mild narrowing of the lumen in proximal sigmoid colon surrounding a rotational pattern of mesenteric vessels please see axial image 54. There is no definite evidence of colonic obstruction or mesenteric edema. No definite evidence of colonic volvulus. No definite evidence of internal hernia. If the patient has persistent colic like abdominal pain follow-up enhanced study with IV and oral contrast could be performed as clinically warranted. 4. No small bowel obstruction. These results were called by telephone at the time of interpretation on 08/30/2014 at 4:08 pm to Dr. Fredia Sorrow , who verbally acknowledged these results.   Electronically Signed By: Lahoma Crocker M.D. On: 08/30/2014 16:08   Assessment & Plan:    1. S/P drainage of prostate abscess DOS 04/15/21 - Urine culture - Urinalysis, Routine w reflex microscopic  2. Gross hematuria  Culture ordered and pt will have FU CT pelvis with and without  3. Pyuria Culture ordered, pt will continue Bactrim and Flomax  Pt advised to stay out of work until FU next week for recheck   Pt presentation and findings discussed with Dr. Alyson Ingles who agrees with assessment and plan.   No follow-ups on file.  Summerlin, Berneice Heinrich, PA-C  [facility] [place]

## 2021-04-30 ENCOUNTER — Telehealth: Payer: Self-pay | Admitting: Physician Assistant

## 2021-04-30 ENCOUNTER — Other Ambulatory Visit: Payer: Self-pay | Admitting: Urology

## 2021-04-30 DIAGNOSIS — N412 Abscess of prostate: Secondary | ICD-10-CM

## 2021-04-30 NOTE — Telephone Encounter (Signed)
STAT CT pelvis with and without contrast (CPT [phone]) scan was ordered on 04/30/21 by Herma Ard, PA.  Per Rosann Auerbach, they will only approve a CT pelvis with contrast.  Auth #[id number], valid 04/30/21 - 10/27/21.  Per Dr. Ronne Binning (secure chat) - okay to proceed with CT pelvis with contrast.  Please change order so it can be sent to scheduling to get this patient scheduled ASAP.

## 2021-05-01 ENCOUNTER — Encounter (HOSPITAL_COMMUNITY): Payer: Self-pay

## 2021-05-01 ENCOUNTER — Other Ambulatory Visit: Payer: Self-pay | Admitting: Urology

## 2021-05-01 ENCOUNTER — Other Ambulatory Visit: Payer: Self-pay

## 2021-05-01 ENCOUNTER — Ambulatory Visit (HOSPITAL_COMMUNITY)
Admission: RE | Admit: 2021-05-01 | Discharge: 2021-05-01 | Disposition: A | Discharge status: Home / Self Care | Payer: Managed Care, Other (non HMO) | Source: Ambulatory Visit | Attending: Urology | Admitting: Urology

## 2021-05-01 DIAGNOSIS — N412 Abscess of prostate: Secondary | ICD-10-CM | POA: Diagnosis not present

## 2021-05-01 MED ORDER — IOHEXOL 300 MG/ML  SOLN
100.0000 mL | Freq: Once | INTRAMUSCULAR | Status: AC | PRN
  Administered 2021-05-01: 100 mL via INTRAVENOUS

## 2021-05-01 MED ORDER — DOXYCYCLINE HYCLATE 100 MG PO CAPS: 100.0000 mg | ORAL_CAPSULE | Freq: Two times a day (BID) | ORAL | 0 refills | Status: AC

## 2021-05-01 NOTE — Progress Notes (Signed)
I sent in 3 weeks of doxycycline since CT shows prostatitis. No abscess seen

## 2021-05-02 LAB — URINE CULTURE

## 2021-05-06 ENCOUNTER — Encounter: Payer: Self-pay | Admitting: Physician Assistant

## 2021-05-06 ENCOUNTER — Ambulatory Visit (INDEPENDENT_AMBULATORY_CARE_PROVIDER_SITE_OTHER): Payer: Managed Care, Other (non HMO) | Admitting: Physician Assistant

## 2021-05-06 ENCOUNTER — Other Ambulatory Visit: Payer: Self-pay

## 2021-05-06 VITALS — BP 124/86 | HR 90 | Wt 251.0 lb

## 2021-05-06 DIAGNOSIS — R310 Gross hematuria: Secondary | ICD-10-CM | POA: Diagnosis not present

## 2021-05-06 DIAGNOSIS — N410 Acute prostatitis: Secondary | ICD-10-CM | POA: Diagnosis not present

## 2021-05-06 DIAGNOSIS — N412 Abscess of prostate: Secondary | ICD-10-CM

## 2021-05-06 DIAGNOSIS — N200 Calculus of kidney: Secondary | ICD-10-CM | POA: Diagnosis not present

## 2021-05-06 DIAGNOSIS — N5314 Retrograde ejaculation: Secondary | ICD-10-CM

## 2021-05-06 LAB — MICROSCOPIC EXAMINATION
Bacteria, UA: NONE SEEN
RBC, Urine: >30 /hpf — AB (ref 0–2)
Renal Epithel, UA: NONE SEEN /hpf

## 2021-05-06 LAB — URINALYSIS, ROUTINE W REFLEX MICROSCOPIC
Bilirubin, UA: NEGATIVE
Glucose, UA: NEGATIVE
Ketones, UA: NEGATIVE
Nitrite, UA: NEGATIVE
Specific Gravity, UA: 1.025 (ref 1.005–1.030)
Urobilinogen, Ur: 0.2 mg/dL (ref 0.2–1.0)
pH, UA: 6.5 (ref 5.0–7.5)

## 2021-05-06 NOTE — Progress Notes (Signed)
Assessment: 1. Gross hematuria - Urinalysis, Routine w reflex microscopic - Urinalysis, Routine w reflex microscopic; Future - Urine Culture   2. Kidney stones - Urinalysis, Routine w reflex microscopic  3. Prostate abscess  4. Ejaculation, retrograde  5. Acute prostatitis   Plan: Continue Doxy and Flomax. Keep FU as already scheduled with Dr. Alyson Ingles. Stay hydrated. Pt may return to work next week as discussed. Discussed retrograde ejaculation to be expected. Urine sent for cx.   Chief Complaint: No chief complaint on file. Prostatitis post abscess drainage  HPI: Raymond Sanchez is a 46 y.o. male who presents for continued evaluation of prostatitis post prostate abscess. DOS 04/15/21. The pt states he is improving today, but still has end of void pressure and distal penile burning and occasional a.m. hematuria. He reports no issues with erections, but is having no ejaculate. No fevers, chills, fatigue. Overall pain significantly improved.  UA  6-10 WBC  >30 RBC  No bacteria   Portions of the above documentation were copied from a prior visit for review purposes only.  Allergies: No Known Allergies  PMH: Past Medical History:  Diagnosis Date   Diabetes mellitus without complication ([place])    Flexural atopic dermatitis 08/05/2019   Hypertension    Mild intermittent asthma, uncomplicated [id number]    PSH: Past Surgical History:  Procedure Laterality Date   APPENDECTOMY     ARTHROSCOPIC REPAIR ACL Right 2001   CHOLECYSTECTOMY     CYSTOSCOPY N/A 04/15/2021   Procedure: CYSTOSCOPY;  Surgeon: Cleon Gustin, MD;  Location: [facility];  Service: Urology;  Laterality: N/A;   FOOT SURGERY     KNEE ARTHROSCOPY     TRANSURETHRAL RESECTION OF PROSTATE N/A 04/15/2021   Procedure: UNROOFING PROSTATIC ABSCESS;  Surgeon: Cleon Gustin, MD;  Location: [facility];  Service: Urology;  Laterality: N/A;    SH: Social History   Tobacco Use   Smoking status: Former    Smokeless tobacco: Current    Types: Snuff  Vaping Use   Vaping Use: Never used  Substance Use Topics   Alcohol use: Yes    Comment: occ.    Drug use: No    ROS: Constitutional:  Negative for fever, chills, weight loss CV: Negative for chest pain, previous MI, hypertension Respiratory:  Negative for shortness of breath, wheezing, sleep apnea, frequent cough GI:  Negative for nausea, vomiting, bloody stool, GERD  PE: BP 124/86    Pulse 90    Wt 251 lb (113.9 kg)    BMI 29.01 kg/m  GENERAL APPEARANCE:  Well appearing, well developed, well nourished, NAD HEENT:  Atraumatic, normocephalic NECK:  Supple. Trachea midline ABDOMEN:  Non distended EXTREMITIES:  Moves all extremities well NEUROLOGIC:  Alert and oriented x 3, normal gait, CN II-XII grossly intact MENTAL STATUS:  appropriate BACK: No CVAT SKIN:  Warm, dry, and intact   Results: Laboratory Data: Lab Results  Component Value Date   WBC 9.4 04/17/2021   HGB 13.0 04/17/2021   HCT 38.3 (L) 04/17/2021   MCV 89.3 04/17/2021   PLT 325 04/17/2021    Lab Results  Component Value Date   CREATININE 1.00 04/17/2021    No results found for: PSA  No results found for: TESTOSTERONE  Lab Results  Component Value Date   HGBA1C 10.4 (H) 04/15/2021    Urinalysis    Component Value Date/Time   COLORURINE YELLOW 04/15/2021 1002   APPEARANCEUR Cloudy (A) 04/29/2021 1535   LABSPEC >1.046 (H) 04/15/2021 1002  PHURINE 5.0 04/15/2021 1002   GLUCOSEU 1+ (A) 04/29/2021 1535   HGBUR MODERATE (A) 04/15/2021 1002   BILIRUBINUR Negative 04/29/2021 1535   KETONESUR 80 (A) 04/15/2021 1002   PROTEINUR 2+ (A) 04/29/2021 1535   PROTEINUR 30 (A) 04/15/2021 1002   UROBILINOGEN 0.2 08/30/2014 1414   NITRITE Negative 04/29/2021 1535   NITRITE NEGATIVE 04/15/2021 1002   LEUKOCYTESUR 1+ (A) 04/29/2021 1535   LEUKOCYTESUR TRACE (A) 04/15/2021 1002    Lab Results  Component Value Date   LABMICR See below: 04/29/2021   WBCUA 6-10  (A) 04/29/2021   LABEPIT None seen 04/29/2021   MUCUS Present 04/29/2021   BACTERIA None seen 04/29/2021    Pertinent Imaging:  Results for orders placed in visit on 04/10/21  Abdomen 1 view (KUB)  Narrative CLINICAL DATA:  Nephrolithiasis.  EXAM: ABDOMEN - 1 VIEW  COMPARISON:  None.  FINDINGS: Both kidneys are largely obscured by bowel gas. No renal or ureteral stones identified within this limitation. No other abnormalities.  IMPRESSION: Limited study as both kidneys are obscured by bowel gas. No renal or ureteral stones noted.   Electronically Signed By: Dorise Bullion III M.D. On: 04/11/2021 11:11  No results found for this or any previous visit.  No results found for this or any previous visit.  No results found for this or any previous visit.  No results found for this or any previous visit.  No results found for this or any previous visit.  No results found for this or any previous visit.  Results for orders placed during the hospital encounter of 08/30/14  CT RENAL STONE STUDY  Narrative CLINICAL DATA:  Right flank pain starting today, status post appendectomy, status postcholecystectomy.  EXAM: CT ABDOMEN AND PELVIS WITHOUT CONTRAST  TECHNIQUE: Multidetector CT imaging of the abdomen and pelvis was performed following the standard protocol without IV contrast.  COMPARISON:  None.  FINDINGS: Sagittal images of the spine shows disc space flattening with vacuum disc phenomenon at L5-S1 level. The lung bases are unremarkable.  The patient is status postcholecystectomy. Unenhanced liver shows no biliary ductal dilatation. Unenhanced pancreas, spleen and adrenal glands are unremarkable. Unenhanced kidneys are symmetrical in size. No aortic aneurysm. No nephrolithiasis. No hydronephrosis or hydroureter. No calcified ureteral calculi are noted.  No small bowel obstruction. No ascites or free air. No adenopathy. Moderate stool and gas noted in  transverse colon right colon and descending colon.  There is a redundant descending colon and especially sigmoid colon. The sigmoid colon is looping in right mid abdomen moderate distended with gas. There is some narrowing of the lumen of proximal sigmoid colon in axial image 53 and 54 without definite evidence of colonic obstruction. Please note this circular segment of the colon is surrounding mild rotational pattern of mesenteric vessels as seen in axial image 54. There is no definite evidence of internal hernia or mesenteric edema or fluid. Clinical correlation is necessary. If the patient has persistent colic like abdominal pain follow-up enhanced study could be performed as clinically warranted.  IMPRESSION: 1. There is no nephrolithiasis.  No hydronephrosis or hydroureter. 2. Status post appendectomy. 3. There is redundant sigmoid colon moderate distended with gas. There is circular short segment mild narrowing of the lumen in proximal sigmoid colon surrounding a rotational pattern of mesenteric vessels please see axial image 54. There is no definite evidence of colonic obstruction or mesenteric edema. No definite evidence of colonic volvulus. No definite evidence of internal hernia. If the patient has  persistent colic like abdominal pain follow-up enhanced study with IV and oral contrast could be performed as clinically warranted. 4. No small bowel obstruction. These results were called by telephone at the time of interpretation on 08/30/2014 at 4:08 pm to Dr. Fredia Sorrow , who verbally acknowledged these results.   Electronically Signed By: Lahoma Crocker M.D. On: 08/30/2014 16:08  No results found for this or any previous visit (from the past 24 hour(s)).

## 2021-05-06 NOTE — Progress Notes (Signed)
Urological Symptom Review  Patient is experiencing the following symptoms: Frequent urination Hard to postpone urination Burning/pain with urination Leakage of urine Blood in urine   Review of Systems  Gastrointestinal (upper)  : Negative for upper GI symptoms  Gastrointestinal (lower) : Negative for lower GI symptoms  Constitutional : Negative for symptoms  Skin: Negative for skin symptoms  Eyes: Negative for eye symptoms  Ear/Nose/Throat : Negative for Ear/Nose/Throat symptoms  Hematologic/Lymphatic: Negative for Hematologic/Lymphatic symptoms  Cardiovascular : Negative for cardiovascular symptoms  Respiratory : Negative for respiratory symptoms  Endocrine: Negative for endocrine symptoms  Musculoskeletal: Negative for musculoskeletal symptoms  Neurological: Negative for neurological symptoms  Psychologic: Negative for psychiatric symptoms

## 2021-05-08 LAB — URINE CULTURE: Organism ID, Bacteria: NO GROWTH

## 2021-05-27 ENCOUNTER — Ambulatory Visit: Payer: Managed Care, Other (non HMO) | Admitting: Urology

## 2021-05-27 ENCOUNTER — Other Ambulatory Visit: Payer: Self-pay

## 2021-05-27 ENCOUNTER — Encounter: Payer: Self-pay | Admitting: Urology

## 2021-05-27 VITALS — BP 116/79 | HR 103

## 2021-05-27 DIAGNOSIS — N412 Abscess of prostate: Secondary | ICD-10-CM

## 2021-05-27 DIAGNOSIS — R3915 Urgency of urination: Secondary | ICD-10-CM

## 2021-05-27 DIAGNOSIS — N410 Acute prostatitis: Secondary | ICD-10-CM

## 2021-05-27 DIAGNOSIS — N3941 Urge incontinence: Secondary | ICD-10-CM

## 2021-05-27 LAB — URINALYSIS, ROUTINE W REFLEX MICROSCOPIC
Bilirubin, UA: NEGATIVE
Glucose, UA: NEGATIVE
Ketones, UA: NEGATIVE
Nitrite, UA: NEGATIVE
Protein,UA: NEGATIVE
Specific Gravity, UA: <1.005 — ABNORMAL LOW (ref 1.005–1.030)
Urobilinogen, Ur: 0.2 mg/dL (ref 0.2–1.0)
pH, UA: 6 (ref 5.0–7.5)

## 2021-05-27 LAB — MICROSCOPIC EXAMINATION: Renal Epithel, UA: NONE SEEN /hpf

## 2021-05-27 NOTE — Patient Instructions (Signed)

## 2021-05-27 NOTE — Progress Notes (Signed)
05/27/2021 3:40 PM   Raymond Sanchez 1975/09/20 [id number]  Referring provider: Lavella Lemons, PA [place],  [place] [address]  Followup prostatitis   HPI: Raymond Sanchez is a 46yo here for followup for prostatitis and prostatic abscess. His urine stream is strong. He has bothersome urinary urgency and occasional urge incontinence since TURP and draining the prostatic abscess. He denies any gross hematuria. UA today shows 1+ RBCs and WBCs. No other complaints today   PMH: Past Medical History:  Diagnosis Date   Diabetes mellitus without complication ([facility])    Flexural atopic dermatitis 08/05/2019   Hypertension    Mild intermittent asthma, uncomplicated [id number]    Surgical History: Past Surgical History:  Procedure Laterality Date   APPENDECTOMY     ARTHROSCOPIC REPAIR ACL Right 2001   CHOLECYSTECTOMY     CYSTOSCOPY N/A 04/15/2021   Procedure: CYSTOSCOPY;  Surgeon: Cleon Gustin, MD;  Location: [facility];  Service: Urology;  Laterality: N/A;   FOOT SURGERY     KNEE ARTHROSCOPY     TRANSURETHRAL RESECTION OF PROSTATE N/A 04/15/2021   Procedure: UNROOFING PROSTATIC ABSCESS;  Surgeon: Cleon Gustin, MD;  Location: [facility];  Service: Urology;  Laterality: N/A;    Home Medications:  Allergies as of 05/27/2021   No Known Allergies      Medication List        Accurate as of May 27, 2021  3:40 PM. If you have any questions, ask your nurse or doctor.          albuterol 108 (90 Base) MCG/ACT inhaler Commonly known as: VENTOLIN HFA Inhale 1-2 puffs into the lungs every 6 (six) hours as needed for wheezing or shortness of breath.   docusate sodium 100 MG capsule Commonly known as: COLACE Take 1 capsule (100 mg total) by mouth 2 (two) times daily.   doxycycline 100 MG capsule Commonly known as: VIBRAMYCIN Take 1 capsule (100 mg total) by mouth every 12 (twelve) hours.   FreeStyle Libre 2 Sensor Misc REPLACE AS DIRECTED   levocetirizine 5 MG  tablet Commonly known as: XYZAL Take 1 tablet (5 mg total) by mouth every evening.   montelukast 10 MG tablet Commonly known as: Singulair Take 1 tablet (10 mg total) by mouth at bedtime.   Mounjaro 2.5 MG/0.5ML Pen Generic drug: tirzepatide SMARTSIG:2.5 Milligram(s) SUB-Q Once a Week   ondansetron 8 MG disintegrating tablet Commonly known as: ZOFRAN-ODT Take 1 tablet (8 mg total) by mouth every 8 (eight) hours as needed for nausea or vomiting.   sulfamethoxazole-trimethoprim 800-160 MG tablet Commonly known as: BACTRIM DS Take 1 tablet by mouth 2 (two) times daily.   tamsulosin 0.4 MG Caps capsule Commonly known as: FLOMAX Take 1 capsule (0.4 mg total) by mouth daily after supper.   Tyler Aas FlexTouch 200 UNIT/ML FlexTouch Pen Generic drug: insulin degludec Inject 32 Units into the skin daily.        Allergies: No Known Allergies  Family History: Family History  Problem Relation Age of Onset   Allergic rhinitis Father    Angioedema Neg Hx    Asthma Neg Hx    Atopy Neg Hx    Eczema Neg Hx    Immunodeficiency Neg Hx    Urticaria Neg Hx     Social History:  reports that he has quit smoking. His smokeless tobacco use includes snuff. He reports current alcohol use. He reports that he does not use drugs.  ROS: All other review of  systems were reviewed and are negative except what is noted above in HPI  Physical Exam: BP 116/79    Pulse (!) 103   Constitutional:  Alert and oriented, No acute distress. HEENT: [place] AT, moist mucus membranes.  Trachea midline, no masses. Cardiovascular: No clubbing, cyanosis, or edema. Respiratory: Normal respiratory effort, no increased work of breathing. GI: Abdomen is soft, nontender, nondistended, no abdominal masses GU: No CVA tenderness.  Lymph: No cervical or inguinal lymphadenopathy. Skin: No rashes, bruises or suspicious lesions. Neurologic: Grossly intact, no focal deficits, moving all 4 extremities. Psychiatric: Normal  mood and affect.  Laboratory Data: Lab Results  Component Value Date   WBC 9.4 04/17/2021   HGB 13.0 04/17/2021   HCT 38.3 (L) 04/17/2021   MCV 89.3 04/17/2021   PLT 325 04/17/2021    Lab Results  Component Value Date   CREATININE 1.00 04/17/2021    No results found for: PSA  No results found for: TESTOSTERONE  Lab Results  Component Value Date   HGBA1C 10.4 (H) 04/15/2021    Urinalysis    Component Value Date/Time   COLORURINE YELLOW 04/15/2021 1002   APPEARANCEUR Clear 05/06/2021 0950   LABSPEC >1.046 (H) 04/15/2021 1002   PHURINE 5.0 04/15/2021 1002   GLUCOSEU Negative 05/06/2021 0950   HGBUR MODERATE (A) 04/15/2021 1002   BILIRUBINUR Negative 05/06/2021 0950   KETONESUR 80 (A) 04/15/2021 1002   PROTEINUR 2+ (A) 05/06/2021 0950   PROTEINUR 30 (A) 04/15/2021 1002   UROBILINOGEN 0.2 08/30/2014 1414   NITRITE Negative 05/06/2021 0950   NITRITE NEGATIVE 04/15/2021 1002   LEUKOCYTESUR 1+ (A) 05/06/2021 0950   LEUKOCYTESUR TRACE (A) 04/15/2021 1002    Lab Results  Component Value Date   LABMICR See below: 05/06/2021   WBCUA 6-10 (A) 05/06/2021   LABEPIT 0-10 05/06/2021   MUCUS Present 05/06/2021   BACTERIA None seen 05/06/2021    Pertinent Imaging:  Results for orders placed in visit on 04/10/21  Abdomen 1 view (KUB)  Narrative CLINICAL DATA:  Nephrolithiasis.  EXAM: ABDOMEN - 1 VIEW  COMPARISON:  None.  FINDINGS: Both kidneys are largely obscured by bowel gas. No renal or ureteral stones identified within this limitation. No other abnormalities.  IMPRESSION: Limited study as both kidneys are obscured by bowel gas. No renal or ureteral stones noted.   Electronically Signed By: Dorise Bullion III M.D. On: 04/11/2021 11:11  No results found for this or any previous visit.  No results found for this or any previous visit.  No results found for this or any previous visit.  No results found for this or any previous visit.  No results  found for this or any previous visit.  No results found for this or any previous visit.  Results for orders placed during the hospital encounter of 08/30/14  CT RENAL STONE STUDY  Narrative CLINICAL DATA:  Right flank pain starting today, status post appendectomy, status postcholecystectomy.  EXAM: CT ABDOMEN AND PELVIS WITHOUT CONTRAST  TECHNIQUE: Multidetector CT imaging of the abdomen and pelvis was performed following the standard protocol without IV contrast.  COMPARISON:  None.  FINDINGS: Sagittal images of the spine shows disc space flattening with vacuum disc phenomenon at L5-S1 level. The lung bases are unremarkable.  The patient is status postcholecystectomy. Unenhanced liver shows no biliary ductal dilatation. Unenhanced pancreas, spleen and adrenal glands are unremarkable. Unenhanced kidneys are symmetrical in size. No aortic aneurysm. No nephrolithiasis. No hydronephrosis or hydroureter. No calcified ureteral calculi are noted.  No small bowel obstruction. No ascites or free air. No adenopathy. Moderate stool and gas noted in transverse colon right colon and descending colon.  There is a redundant descending colon and especially sigmoid colon. The sigmoid colon is looping in right mid abdomen moderate distended with gas. There is some narrowing of the lumen of proximal sigmoid colon in axial image 53 and 54 without definite evidence of colonic obstruction. Please note this circular segment of the colon is surrounding mild rotational pattern of mesenteric vessels as seen in axial image 54. There is no definite evidence of internal hernia or mesenteric edema or fluid. Clinical correlation is necessary. If the patient has persistent colic like abdominal pain follow-up enhanced study could be performed as clinically warranted.  IMPRESSION: 1. There is no nephrolithiasis.  No hydronephrosis or hydroureter. 2. Status post appendectomy. 3. There is redundant  sigmoid colon moderate distended with gas. There is circular short segment mild narrowing of the lumen in proximal sigmoid colon surrounding a rotational pattern of mesenteric vessels please see axial image 54. There is no definite evidence of colonic obstruction or mesenteric edema. No definite evidence of colonic volvulus. No definite evidence of internal hernia. If the patient has persistent colic like abdominal pain follow-up enhanced study with IV and oral contrast could be performed as clinically warranted. 4. No small bowel obstruction. These results were called by telephone at the time of interpretation on 08/30/2014 at 4:08 pm to Dr. Fredia Sorrow , who verbally acknowledged these results.   Electronically Signed By: Lahoma Crocker M.D. On: 08/30/2014 16:08   Assessment & Plan:    1. Prostate abscess -resolved  2. Urinary urgency -we will trial mirabegron 25mg  daily   No follow-ups on file.  Nicolette Bang, MD  [facility] [place]

## 2021-07-08 ENCOUNTER — Ambulatory Visit: Payer: Managed Care, Other (non HMO) | Admitting: Urology

## 2021-07-27 ENCOUNTER — Encounter: Payer: Self-pay | Admitting: Urology

## 2021-07-27 ENCOUNTER — Ambulatory Visit: Payer: Managed Care, Other (non HMO) | Admitting: Urology

## 2021-07-27 VITALS — BP 116/75 | HR 85

## 2021-07-27 DIAGNOSIS — R3915 Urgency of urination: Secondary | ICD-10-CM | POA: Diagnosis not present

## 2021-07-27 DIAGNOSIS — E291 Testicular hypofunction: Secondary | ICD-10-CM

## 2021-07-27 NOTE — Progress Notes (Signed)
? ?07/27/2021 ?3:49 PM  ? ?[name] ?Dec 30, 1975 ?[id number] ? ?Referring provider: Lovey Newcomer, PA ?[address] ?[place],  [place] [address] ? ?Followup urinary urgency ? ? ?HPI: ?Mr [place] is a 46yo here for followup after prostate abscess. Last visit he was started on mirabegron 25mg  for the urgency which on slightly improved the urgency but then after finishing the mirabegron he noted the the urgency has resolved. His new complaint is fatigue and intermittent issues with erectile dysfunction. Testosterone was 190 per patient. He has decreased libido.  ? ? ?PMH: ?Past Medical History:  ?Diagnosis Date  ? Diabetes mellitus without complication ([facility])   ? Flexural atopic dermatitis 08/05/2019  ? Hypertension   ? Mild intermittent asthma, uncomplicated 08/05/2019  ? ? ?Surgical History: ?Past Surgical History:  ?Procedure Laterality Date  ? APPENDECTOMY    ? ARTHROSCOPIC REPAIR ACL Right 2001  ? CHOLECYSTECTOMY    ? CYSTOSCOPY N/A 04/15/2021  ? Procedure: CYSTOSCOPY;  Surgeon: 06/13/2021, MD;  Location: [facility];  Service: Urology;  Laterality: N/A;  ? FOOT SURGERY    ? KNEE ARTHROSCOPY    ? TRANSURETHRAL RESECTION OF PROSTATE N/A 04/15/2021  ? Procedure: UNROOFING PROSTATIC ABSCESS;  Surgeon: 06/13/2021, MD;  Location: [facility];  Service: Urology;  Laterality: N/A;  ? ? ?Home Medications:  ?Allergies as of 07/27/2021   ?No Known Allergies ?  ? ?  ?Medication List  ?  ? ?  ? Accurate as of July 27, 2021  3:49 PM. If you have any questions, ask your nurse or doctor.  ?  ?  ? ?  ? ?albuterol 108 (90 Base) MCG/ACT inhaler ?Commonly known as: VENTOLIN HFA ?Inhale 1-2 puffs into the lungs every 6 (six) hours as needed for wheezing or shortness of breath. ?  ?docusate sodium 100 MG capsule ?Commonly known as: COLACE ?Take 1 capsule (100 mg total) by mouth 2 (two) times daily. ?  ?doxycycline 100 MG capsule ?Commonly known as: VIBRAMYCIN ?Take 1 capsule (100 mg total) by mouth every 12 (twelve) hours. ?   ?FreeStyle Libre 2 Sensor Misc ?REPLACE AS DIRECTED ?  ?levocetirizine 5 MG tablet ?Commonly known as: XYZAL ?Take 1 tablet (5 mg total) by mouth every evening. ?  ?montelukast 10 MG tablet ?Commonly known as: Singulair ?Take 1 tablet (10 mg total) by mouth at bedtime. ?  ?Mounjaro 2.5 MG/0.5ML Pen ?Generic drug: tirzepatide ?SMARTSIG:2.5 Milligram(s) SUB-Q Once a Week ?  ?Mounjaro 5 MG/0.5ML Pen ?Generic drug: tirzepatide ?Inject into the skin. ?  ?ondansetron 8 MG disintegrating tablet ?Commonly known as: ZOFRAN-ODT ?Take 1 tablet (8 mg total) by mouth every 8 (eight) hours as needed for nausea or vomiting. ?  ?sulfamethoxazole-trimethoprim 800-160 MG tablet ?Commonly known as: BACTRIM DS ?Take 1 tablet by mouth 2 (two) times daily. ?  ?tamsulosin 0.4 MG Caps capsule ?Commonly known as: FLOMAX ?Take 1 capsule (0.4 mg total) by mouth daily after supper. ?  ?July 29, 2021 FlexTouch 200 UNIT/ML FlexTouch Pen ?Generic drug: insulin degludec ?Inject 32 Units into the skin daily. ?  ? ?  ? ? ?Allergies: No Known Allergies ? ?Family History: ?Family History  ?Problem Relation Age of Onset  ? Allergic rhinitis Father   ? Angioedema Neg Hx   ? Asthma Neg Hx   ? Atopy Neg Hx   ? Eczema Neg Hx   ? Immunodeficiency Neg Hx   ? Urticaria Neg Hx   ? ? ?Social History:  reports that he has quit  smoking. His smokeless tobacco use includes snuff. He reports current alcohol use. He reports that he does not use drugs. ? ?ROS: ?All other review of systems were reviewed and are negative except what is noted above in HPI ? ?Physical Exam: ?BP 116/75   Pulse 85   ?Constitutional:  Alert and oriented, No acute distress. ?HEENT: [place] AT, moist mucus membranes.  Trachea midline, no masses. ?Cardiovascular: No clubbing, cyanosis, or edema. ?Respiratory: Normal respiratory effort, no increased work of breathing. ?GI: Abdomen is soft, nontender, nondistended, no abdominal masses ?GU: No CVA tenderness.  ?Lymph: No cervical or inguinal  lymphadenopathy. ?Skin: No rashes, bruises or suspicious lesions. ?Neurologic: Grossly intact, no focal deficits, moving all 4 extremities. ?Psychiatric: Normal mood and affect. ? ?Laboratory Data: ?Lab Results  ?Component Value Date  ? WBC 9.4 04/17/2021  ? HGB 13.0 04/17/2021  ? HCT 38.3 (L) 04/17/2021  ? MCV 89.3 04/17/2021  ? PLT 325 04/17/2021  ? ? ?Lab Results  ?Component Value Date  ? CREATININE 1.00 04/17/2021  ? ? ?No results found for: PSA ? ?No results found for: TESTOSTERONE ? ?Lab Results  ?Component Value Date  ? HGBA1C 10.4 (H) 04/15/2021  ? ? ?Urinalysis ?   ?Component Value Date/Time  ? COLORURINE YELLOW 04/15/2021 1002  ? APPEARANCEUR Clear 05/27/2021 1541  ? LABSPEC >1.046 (H) 04/15/2021 1002  ? PHURINE 5.0 04/15/2021 1002  ? GLUCOSEU Negative 05/27/2021 1541  ? HGBUR MODERATE (A) 04/15/2021 1002  ? BILIRUBINUR Negative 05/27/2021 1541  ? KETONESUR 80 (A) 04/15/2021 1002  ? PROTEINUR Negative 05/27/2021 1541  ? PROTEINUR 30 (A) 04/15/2021 1002  ? UROBILINOGEN 0.2 08/30/2014 1414  ? NITRITE Negative 05/27/2021 1541  ? NITRITE NEGATIVE 04/15/2021 1002  ? LEUKOCYTESUR 1+ (A) 05/27/2021 1541  ? LEUKOCYTESUR TRACE (A) 04/15/2021 1002  ? ? ?Lab Results  ?Component Value Date  ? LABMICR See below: 05/27/2021  ? WBCUA 11-30 (A) 05/27/2021  ? LABEPIT 0-10 05/27/2021  ? MUCUS Present 05/06/2021  ? BACTERIA Few (A) 05/27/2021  ? ? ?Pertinent Imaging: ? ?Results for orders placed in visit on 04/10/21 ? ?Abdomen 1 view (KUB) ? ?Narrative ?CLINICAL DATA:  Nephrolithiasis. ? ?EXAM: ?ABDOMEN - 1 VIEW ? ?COMPARISON:  None. ? ?FINDINGS: ?Both kidneys are largely obscured by bowel gas. No renal or ureteral ?stones identified within this limitation. No other abnormalities. ? ?IMPRESSION: ?Limited study as both kidneys are obscured by bowel gas. No renal or ?ureteral stones noted. ? ? ?Electronically Signed ?By: Gerome Sam III M.D. ?On: 04/11/2021 11:11 ? ?No results found for this or any previous visit. ? ?No  results found for this or any previous visit. ? ?No results found for this or any previous visit. ? ?No results found for this or any previous visit. ? ?No results found for this or any previous visit. ? ?No results found for this or any previous visit. ? ?Results for orders placed during the hospital encounter of 08/30/14 ? ?CT RENAL STONE STUDY ? ?Narrative ?CLINICAL DATA:  Right flank pain starting today, status post ?appendectomy, status postcholecystectomy. ? ?EXAM: ?CT ABDOMEN AND PELVIS WITHOUT CONTRAST ? ?TECHNIQUE: ?Multidetector CT imaging of the abdomen and pelvis was performed ?following the standard protocol without IV contrast. ? ?COMPARISON:  None. ? ?FINDINGS: ?Sagittal images of the spine shows disc space flattening with vacuum ?disc phenomenon at L5-S1 level. The lung bases are unremarkable. ? ?The patient is status postcholecystectomy. Unenhanced liver shows no ?biliary ductal dilatation. Unenhanced pancreas, spleen and adrenal ?glands are  unremarkable. Unenhanced kidneys are symmetrical in size. ?No aortic aneurysm. No nephrolithiasis. No hydronephrosis or ?hydroureter. No calcified ureteral calculi are noted. ? ?No small bowel obstruction. No ascites or free air. No adenopathy. ?Moderate stool and gas noted in transverse colon right colon and ?descending colon. ? ?There is a redundant descending colon and especially sigmoid colon. ?The sigmoid colon is looping in right mid abdomen moderate distended ?with gas. There is some narrowing of the lumen of proximal sigmoid ?colon in axial image 53 and 54 without definite evidence of colonic ?obstruction. Please note this circular segment of the colon is ?surrounding mild rotational pattern of mesenteric vessels as seen in ?axial image 54. There is no definite evidence of internal hernia or ?mesenteric edema or fluid. Clinical correlation is necessary. If the ?patient has persistent colic like abdominal pain follow-up enhanced ?study could be performed  as clinically warranted. ? ?IMPRESSION: ?1. There is no nephrolithiasis.  No hydronephrosis or hydroureter. ?2. Status post appendectomy. ?3. There is redundant sigmoid colon moderate distended with gas. ?There is circular s

## 2021-07-27 NOTE — Patient Instructions (Signed)

## 2021-07-28 LAB — URINALYSIS, ROUTINE W REFLEX MICROSCOPIC
Bilirubin, UA: NEGATIVE
Leukocytes,UA: NEGATIVE
Nitrite, UA: NEGATIVE
RBC, UA: NEGATIVE
Specific Gravity, UA: 1.02 (ref 1.005–1.030)
Urobilinogen, Ur: 0.2 mg/dL (ref 0.2–1.0)
pH, UA: 7 (ref 5.0–7.5)

## 2021-07-28 LAB — PROLACTIN: Prolactin: 13.8 ng/mL (ref 4.0–15.2)

## 2021-07-28 LAB — ESTRADIOL: Estradiol: 19.7 pg/mL (ref 7.6–42.6)

## 2021-08-07 ENCOUNTER — Telehealth: Payer: Self-pay

## 2021-08-07 NOTE — Telephone Encounter (Signed)
Called patient to give him results.  He states he would like to start the testosterone replacement therapy with you.  Please advise on next steps.  ?

## 2021-08-25 ENCOUNTER — Other Ambulatory Visit: Payer: Self-pay | Admitting: Urology

## 2021-08-25 MED ORDER — CLOMIPHENE CITRATE 50 MG PO TABS: 25.0000 mg | ORAL_TABLET | Freq: Every day | ORAL | 3 refills | Status: DC

## 2021-08-25 NOTE — Telephone Encounter (Signed)
Pt notified via My Chart

## 2021-08-31 ENCOUNTER — Ambulatory Visit: Payer: Managed Care, Other (non HMO) | Admitting: Urology

## 2021-10-26 ENCOUNTER — Encounter: Payer: Self-pay | Admitting: Urology

## 2021-10-26 ENCOUNTER — Ambulatory Visit (INDEPENDENT_AMBULATORY_CARE_PROVIDER_SITE_OTHER): Payer: Managed Care, Other (non HMO) | Admitting: Urology

## 2021-10-26 VITALS — BP 127/83 | HR 90

## 2021-10-26 DIAGNOSIS — E291 Testicular hypofunction: Secondary | ICD-10-CM | POA: Diagnosis not present

## 2021-10-26 LAB — URINALYSIS, ROUTINE W REFLEX MICROSCOPIC
Bilirubin, UA: NEGATIVE
Leukocytes,UA: NEGATIVE
Nitrite, UA: NEGATIVE
RBC, UA: NEGATIVE
Specific Gravity, UA: 1.02 (ref 1.005–1.030)
Urobilinogen, Ur: 1 mg/dL (ref 0.2–1.0)
pH, UA: 7 (ref 5.0–7.5)

## 2021-10-26 LAB — MICROSCOPIC EXAMINATION
Epithelial Cells (non renal): NONE SEEN /hpf (ref 0–10)
RBC, Urine: NONE SEEN /hpf (ref 0–2)
Renal Epithel, UA: NONE SEEN /hpf
WBC, UA: NONE SEEN /hpf (ref 0–5)

## 2021-10-26 MED ORDER — CLOMIPHENE CITRATE 50 MG PO TABS: 25.0000 mg | ORAL_TABLET | Freq: Every day | ORAL | 3 refills | Status: DC

## 2021-10-26 NOTE — Progress Notes (Signed)
10/26/2021 4:06 PM   Raymond Sanchez 01/20/1976 [id number]  Referring provider: Lovey Newcomer, PA [address] [place],  [place] [address]  Followup hypogonadism   HPI: [name] is a 46yo here for followup for hypogonadism. He notes improved energy since starting clomid 25mg  daily. No recent testosterone labs. Good libido. No issues with erections.    PMH: Past Medical History:  Diagnosis Date   Diabetes mellitus without complication (HCC)    Flexural atopic dermatitis 08/05/2019   Hypertension    Mild intermittent asthma, uncomplicated 08/05/2019    Surgical History: Past Surgical History:  Procedure Laterality Date   APPENDECTOMY     ARTHROSCOPIC REPAIR ACL Right 2001   CHOLECYSTECTOMY     CYSTOSCOPY N/A 04/15/2021   Procedure: CYSTOSCOPY;  Surgeon: 06/13/2021, MD;  Location: [facility];  Service: Urology;  Laterality: N/A;   FOOT SURGERY     KNEE ARTHROSCOPY     TRANSURETHRAL RESECTION OF PROSTATE N/A 04/15/2021   Procedure: UNROOFING PROSTATIC ABSCESS;  Surgeon: 06/13/2021, MD;  Location: [facility];  Service: Urology;  Laterality: N/A;    Home Medications:  Allergies as of 10/26/2021   No Known Allergies      Medication List        Accurate as of October 26, 2021  4:06 PM. If you have any questions, ask your nurse or doctor.          albuterol 108 (90 Base) MCG/ACT inhaler Commonly known as: VENTOLIN HFA Inhale 1-2 puffs into the lungs every 6 (six) hours as needed for wheezing or shortness of breath.   clomiPHENE 50 MG tablet Commonly known as: CLOMID Take 0.5 tablets (25 mg total) by mouth daily.   docusate sodium 100 MG capsule Commonly known as: COLACE Take 1 capsule (100 mg total) by mouth 2 (two) times daily.   doxycycline 100 MG capsule Commonly known as: VIBRAMYCIN Take 1 capsule (100 mg total) by mouth every 12 (twelve) hours.   FreeStyle Libre 2 Sensor Misc REPLACE AS DIRECTED   levocetirizine 5 MG tablet Commonly known as:  XYZAL Take 1 tablet (5 mg total) by mouth every evening.   montelukast 10 MG tablet Commonly known as: Singulair Take 1 tablet (10 mg total) by mouth at bedtime.   Mounjaro 2.5 MG/0.5ML Pen Generic drug: tirzepatide SMARTSIG:2.5 Milligram(s) SUB-Q Once a Week   Mounjaro 5 MG/0.5ML Pen Generic drug: tirzepatide Inject into the skin.   ondansetron 8 MG disintegrating tablet Commonly known as: ZOFRAN-ODT Take 1 tablet (8 mg total) by mouth every 8 (eight) hours as needed for nausea or vomiting.   sulfamethoxazole-trimethoprim 800-160 MG tablet Commonly known as: BACTRIM DS Take 1 tablet by mouth 2 (two) times daily.   tamsulosin 0.4 MG Caps capsule Commonly known as: FLOMAX Take 1 capsule (0.4 mg total) by mouth daily after supper.   October 28, 2021 FlexTouch 200 UNIT/ML FlexTouch Pen Generic drug: insulin degludec Inject 32 Units into the skin daily.        Allergies: No Known Allergies  Family History: Family History  Problem Relation Age of Onset   Allergic rhinitis Father    Angioedema Neg Hx    Asthma Neg Hx    Atopy Neg Hx    Eczema Neg Hx    Immunodeficiency Neg Hx    Urticaria Neg Hx     Social History:  reports that he has quit smoking. His smokeless tobacco use includes snuff. He reports current alcohol use. He reports  that he does not use drugs.  ROS: All other review of systems were reviewed and are negative except what is noted above in HPI  Physical Exam: BP 127/83   Pulse 90   Constitutional:  Alert and oriented, No acute distress. HEENT: Raymond Sanchez AT, moist mucus membranes.  Trachea midline, no masses. Cardiovascular: No clubbing, cyanosis, or edema. Respiratory: Normal respiratory effort, no increased work of breathing. GI: Abdomen is soft, nontender, nondistended, no abdominal masses GU: No CVA tenderness.  Lymph: No cervical or inguinal lymphadenopathy. Skin: No rashes, bruises or suspicious lesions. Neurologic: Grossly intact, no focal deficits,  moving all 4 extremities. Psychiatric: Normal mood and affect.  Laboratory Data: Lab Results  Component Value Date   WBC 9.4 04/17/2021   HGB 13.0 04/17/2021   HCT 38.3 (L) 04/17/2021   MCV 89.3 04/17/2021   PLT 325 04/17/2021    Lab Results  Component Value Date   CREATININE 1.00 04/17/2021    No results found for: "PSA"  No results found for: "TESTOSTERONE"  Lab Results  Component Value Date   HGBA1C 10.4 (H) 04/15/2021    Urinalysis    Component Value Date/Time   COLORURINE YELLOW 04/15/2021 1002   APPEARANCEUR Clear 07/27/2021 1541   LABSPEC >1.046 (H) 04/15/2021 1002   PHURINE 5.0 04/15/2021 1002   GLUCOSEU 2+ (A) 07/27/2021 1541   HGBUR MODERATE (A) 04/15/2021 1002   BILIRUBINUR Negative 07/27/2021 1541   KETONESUR 80 (A) 04/15/2021 1002   PROTEINUR Trace (A) 07/27/2021 1541   PROTEINUR 30 (A) 04/15/2021 1002   UROBILINOGEN 0.2 08/30/2014 1414   NITRITE Negative 07/27/2021 1541   NITRITE NEGATIVE 04/15/2021 1002   LEUKOCYTESUR Negative 07/27/2021 1541   LEUKOCYTESUR TRACE (A) 04/15/2021 1002    Lab Results  Component Value Date   LABMICR Comment 07/27/2021   WBCUA 11-30 (A) 05/27/2021   LABEPIT 0-10 05/27/2021   MUCUS Present 05/06/2021   BACTERIA Few (A) 05/27/2021    Pertinent Imaging:  Results for orders placed in visit on 04/10/21  Abdomen 1 view (KUB)  Narrative CLINICAL DATA:  Nephrolithiasis.  EXAM: ABDOMEN - 1 VIEW  COMPARISON:  None.  FINDINGS: Both kidneys are largely obscured by bowel gas. No renal or ureteral stones identified within this limitation. No other abnormalities.  IMPRESSION: Limited study as both kidneys are obscured by bowel gas. No renal or ureteral stones noted.   Electronically Signed By: Gerome Sam III M.D. On: 04/11/2021 11:11  No results found for this or any previous visit.  No results found for this or any previous visit.  No results found for this or any previous visit.  No results  found for this or any previous visit.  No results found for this or any previous visit.  No results found for this or any previous visit.  Results for orders placed during the hospital encounter of 08/30/14  CT RENAL STONE STUDY  Narrative CLINICAL DATA:  Right flank pain starting today, status post appendectomy, status postcholecystectomy.  EXAM: CT ABDOMEN AND PELVIS WITHOUT CONTRAST  TECHNIQUE: Multidetector CT imaging of the abdomen and pelvis was performed following the standard protocol without IV contrast.  COMPARISON:  None.  FINDINGS: Sagittal images of the spine shows disc space flattening with vacuum disc phenomenon at L5-S1 level. The lung bases are unremarkable.  The patient is status postcholecystectomy. Unenhanced liver shows no biliary ductal dilatation. Unenhanced pancreas, spleen and adrenal glands are unremarkable. Unenhanced kidneys are symmetrical in size. No aortic aneurysm. No nephrolithiasis. No hydronephrosis  or hydroureter. No calcified ureteral calculi are noted.  No small bowel obstruction. No ascites or free air. No adenopathy. Moderate stool and gas noted in transverse colon right colon and descending colon.  There is a redundant descending colon and especially sigmoid colon. The sigmoid colon is looping in right mid abdomen moderate distended with gas. There is some narrowing of the lumen of proximal sigmoid colon in axial image 53 and 54 without definite evidence of colonic obstruction. Please note this circular segment of the colon is surrounding mild rotational pattern of mesenteric vessels as seen in axial image 54. There is no definite evidence of internal hernia or mesenteric edema or fluid. Clinical correlation is necessary. If the patient has persistent colic like abdominal pain follow-up enhanced study could be performed as clinically warranted.  IMPRESSION: 1. There is no nephrolithiasis.  No hydronephrosis or hydroureter. 2.  Status post appendectomy. 3. There is redundant sigmoid colon moderate distended with gas. There is circular short segment mild narrowing of the lumen in proximal sigmoid colon surrounding a rotational pattern of mesenteric vessels please see axial image 54. There is no definite evidence of colonic obstruction or mesenteric edema. No definite evidence of colonic volvulus. No definite evidence of internal hernia. If the patient has persistent colic like abdominal pain follow-up enhanced study with IV and oral contrast could be performed as clinically warranted. 4. No small bowel obstruction. These results were called by telephone at the time of interpretation on 08/30/2014 at 4:08 pm to Dr. Vanetta Mulders , who verbally acknowledged these results.   Electronically Signed By: Natasha Mead M.D. On: 08/30/2014 16:08   Assessment & Plan:    1. Hypogonadism in male  - Urinalysis, Routine w reflex microscopic - Testosterone,Free and Total - Estradiol - CBC With differential/Platelet - Comprehensive metabolic panel - Testosterone,Free and Total; Future - Estradiol; Future - CBC; Future - Comprehensive metabolic panel; Future   Return in about 3 months (around 01/26/2022) for testosterone labs.  Wilkie Aye, MD  [facility] [place]

## 2021-10-26 NOTE — Patient Instructions (Signed)

## 2021-10-31 LAB — COMPREHENSIVE METABOLIC PANEL
ALT: 27 IU/L (ref 0–44)
AST: 20 IU/L (ref 0–40)
Albumin/Globulin Ratio: 1.8 (ref 1.2–2.2)
Albumin: 4.5 g/dL (ref 4.1–5.1)
Alkaline Phosphatase: 63 IU/L (ref 44–121)
BUN/Creatinine Ratio: 9 (ref 9–20)
BUN: 10 mg/dL (ref 6–24)
Bilirubin Total: 0.4 mg/dL (ref 0.0–1.2)
CO2: 23 mmol/L (ref 20–29)
Calcium: 9.7 mg/dL (ref 8.7–10.2)
Chloride: 106 mmol/L (ref 96–106)
Creatinine, Ser: 1.15 mg/dL (ref 0.76–1.27)
Globulin, Total: 2.5 g/dL (ref 1.5–4.5)
Glucose: 116 mg/dL — ABNORMAL HIGH (ref 70–99)
Potassium: 4.8 mmol/L (ref 3.5–5.2)
Sodium: 143 mmol/L (ref 134–144)
Total Protein: 7 g/dL (ref 6.0–8.5)
eGFR: 80 mL/min/{1.73_m2} (ref >59)

## 2021-10-31 LAB — CBC WITH DIFFERENTIAL
Basophils Absolute: 0 10*3/uL (ref 0.0–0.2)
Basos: 1 %
EOS (ABSOLUTE): 0.1 10*3/uL (ref 0.0–0.4)
Eos: 2 %
Hematocrit: 47.4 % (ref 37.5–51.0)
Hemoglobin: 15.9 g/dL (ref 13.0–17.7)
Immature Grans (Abs): 0.1 10*3/uL (ref 0.0–0.1)
Immature Granulocytes: 1 %
Lymphocytes Absolute: 1.6 10*3/uL (ref 0.7–3.1)
Lymphs: 23 %
MCH: 30.3 pg (ref 26.6–33.0)
MCHC: 33.5 g/dL (ref 31.5–35.7)
MCV: 90 fL (ref 79–97)
Monocytes Absolute: 0.4 10*3/uL (ref 0.1–0.9)
Monocytes: 6 %
Neutrophils Absolute: 4.6 10*3/uL (ref 1.4–7.0)
Neutrophils: 67 %
RBC: 5.25 x10E6/uL (ref 4.14–5.80)
RDW: 12.9 % (ref 11.6–15.4)
WBC: 6.8 10*3/uL (ref 3.4–10.8)

## 2021-10-31 LAB — ESTRADIOL: Estradiol: 58.1 pg/mL — ABNORMAL HIGH (ref 7.6–42.6)

## 2021-10-31 LAB — TESTOSTERONE,FREE AND TOTAL
Testosterone, Free: 16.9 pg/mL (ref 6.8–21.5)
Testosterone: 572 ng/dL (ref 264–916)

## 2022-01-25 ENCOUNTER — Other Ambulatory Visit: Payer: Managed Care, Other (non HMO)

## 2022-01-27 ENCOUNTER — Ambulatory Visit: Payer: Managed Care, Other (non HMO) | Admitting: Urology

## 2022-02-01 ENCOUNTER — Other Ambulatory Visit: Payer: Managed Care, Other (non HMO)

## 2022-02-06 LAB — CBC
Hematocrit: 50.1 % (ref 37.5–51.0)
Hemoglobin: 16.8 g/dL (ref 13.0–17.7)
MCH: 29.6 pg (ref 26.6–33.0)
MCHC: 33.5 g/dL (ref 31.5–35.7)
MCV: 88 fL (ref 79–97)
Platelets: 239 10*3/uL (ref 150–450)
RBC: 5.67 x10E6/uL (ref 4.14–5.80)
RDW: 12.8 % (ref 11.6–15.4)
WBC: 5.7 10*3/uL (ref 3.4–10.8)

## 2022-02-06 LAB — COMPREHENSIVE METABOLIC PANEL
ALT: 22 IU/L (ref 0–44)
AST: 17 IU/L (ref 0–40)
Albumin/Globulin Ratio: 1.7 (ref 1.2–2.2)
Albumin: 4.3 g/dL (ref 4.1–5.1)
Alkaline Phosphatase: 66 IU/L (ref 44–121)
BUN/Creatinine Ratio: 18 (ref 9–20)
BUN: 17 mg/dL (ref 6–24)
Bilirubin Total: 0.4 mg/dL (ref 0.0–1.2)
CO2: 21 mmol/L (ref 20–29)
Calcium: 9.4 mg/dL (ref 8.7–10.2)
Chloride: 101 mmol/L (ref 96–106)
Creatinine, Ser: 0.97 mg/dL (ref 0.76–1.27)
Globulin, Total: 2.5 g/dL (ref 1.5–4.5)
Glucose: 151 mg/dL — ABNORMAL HIGH (ref 70–99)
Potassium: 4.8 mmol/L (ref 3.5–5.2)
Sodium: 141 mmol/L (ref 134–144)
Total Protein: 6.8 g/dL (ref 6.0–8.5)
eGFR: 99 mL/min/{1.73_m2} (ref >59)

## 2022-02-06 LAB — TESTOSTERONE,FREE AND TOTAL
Testosterone, Free: 18 pg/mL (ref 6.8–21.5)
Testosterone: 705 ng/dL (ref 264–916)

## 2022-07-06 ENCOUNTER — Other Ambulatory Visit: Payer: Self-pay

## 2022-07-06 DIAGNOSIS — E291 Testicular hypofunction: Secondary | ICD-10-CM

## 2022-07-06 NOTE — Telephone Encounter (Signed)
Patient has been out of Rx for 2 weeks. Needing refill on:  clomiPHENE (CLOMID) 50 MG tablet    Patient needing results from last lab.  Also wants to know if he needs to reschedule his appointment.  Please advise.

## 2022-07-07 MED ORDER — CLOMIPHENE CITRATE 50 MG PO TABS: 25.0000 mg | ORAL_TABLET | Freq: Every day | ORAL | 0 refills | Status: DC

## 2022-07-07 NOTE — Telephone Encounter (Signed)
Patient checking on status of refill.  Please call patient once called in.

## 2022-07-12 ENCOUNTER — Other Ambulatory Visit (INDEPENDENT_AMBULATORY_CARE_PROVIDER_SITE_OTHER): Payer: Managed Care, Other (non HMO)

## 2022-07-12 ENCOUNTER — Ambulatory Visit (INDEPENDENT_AMBULATORY_CARE_PROVIDER_SITE_OTHER): Payer: Managed Care, Other (non HMO) | Admitting: Orthopaedic Surgery

## 2022-07-12 ENCOUNTER — Encounter: Payer: Self-pay | Admitting: Orthopaedic Surgery

## 2022-07-12 VITALS — BP 128/82 | HR 86 | Ht 78.0 in | Wt 275.0 lb

## 2022-07-12 DIAGNOSIS — M25562 Pain in left knee: Secondary | ICD-10-CM

## 2022-07-12 NOTE — Progress Notes (Addendum)
Office Visit Note   Patient: Raymond Sanchez           Date of Birth: 1975-12-16           MRN: [id number] Visit Date: 07/12/2022              Requested by: Lavella Lemons, PA [place],  [place] [address] PCP: Lavella Lemons, [place]   Assessment & Plan: Visit Diagnoses:  1. Acute pain of left knee     Plan: Twisting injury left knee likely meniscal tear posteriorly with his current symptoms.  Will place him on modified light duty no kneeling crawling limited standing.  MRI scan left knee ordered without contrast.  I will follow-up after scan for review.  Continue Mobic.  Return after MRI scan.  Follow-Up Instructions: No follow-ups on file.   Orders:  Orders Placed This Encounter  Procedures   XR KNEE 3 VIEW LEFT   No orders of the defined types were placed in this encounter.     Procedures: No procedures performed   Clinical Data: No additional findings.   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  Past history of 3 surgeries on the right knee 1 single surgery left knee for possible meniscus in the past performed by Dr. Sinclair Grooms 10 is retired.  He had some oxycodone that he had from previous surgery that he took for pain he has been able to ambulate with minimal range of motion of his knee walking with only about 20 degrees knee flexion.  Slow getting from sitting to standing.  He reports that he fell after hyperextending and pivoting it and then since that time it is felt unstable.  No groin pain no numbness tingling in his feet no associated back pain.  Patient is a type II diabetic on [place].  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  He already been on some Mobic by his PCP for about 6 months for some back discomfort problems.  (Does not need an anti-inflammatory already on it)  Review of Systems all other systems noncontributory to HPI.   Objective: Vital Signs: BP 128/82   Pulse 86   Ht 6\' 6"  (1.981 m)   Wt 275 lb (124.7 kg)   BMI 31.78 kg/m   Physical Exam Constitutional:      Appearance: He is well-developed.  HENT:     Head: Normocephalic and atraumatic.     Right Ear: External ear normal.     Left Ear: External ear normal.  Eyes:     Pupils: Pupils are equal, round, and reactive to light.  Neck:     Thyroid: No thyromegaly.     Trachea: No tracheal deviation.  Cardiovascular:     Rate and Rhythm: Normal rate.  Pulmonary:     Effort: Pulmonary effort is normal.     Breath sounds: No wheezing.  Abdominal:     General: Bowel sounds are normal.     Palpations: Abdomen is soft.  Musculoskeletal:     Cervical back: Neck supple.  Skin:    General: Skin is warm and  dry.     Capillary Refill: Capillary refill takes less than 2 seconds.  Neurological:     Mental Status: He is alert and oriented to person, place, and time.  Psychiatric:        Behavior: Behavior normal.        Thought Content: Thought content normal.        Judgment: Judgment normal.     Ortho Exam patient with the trace swelling left knee.  Superficial dilated veins adjacent to the medial aspect of the knee nontender.  No lower extremity ankle or foot edema.  He has sharp pain with attempts to reach full extension he has posterior medial and posterior lateral joint line tenderness.  Tenderness along the popliteal region normal pulses.  Collateral ligaments are stable negative Lachman test.  Patient is able to ambulate has a left knee limp and has limited flexion of his knee and lacks 10 degrees reaching full extension with heel strike.  Specialty Comments:  No specialty comments available.  Imaging: No  results found.   PMFS History: Patient Active Problem List   Diagnosis Date Noted   Ejaculation, retrograde 05/06/2021   Gross hematuria 04/29/2021   Prostatic abscess 04/15/2021   Colitis 04/15/2021   Asthma 04/15/2021   Hyperglycemia due to diabetes mellitus 04/15/2021   Prostate abscess 04/15/2021   Prostatitis 04/10/2021   Mild intermittent asthma, uncomplicated [id number]   Seasonal and perennial allergic rhinitis 08/05/2019   Flexural atopic dermatitis 08/05/2019   Insect sting allergy 08/05/2019   Past Medical History:  Diagnosis Date   Diabetes mellitus without complication    Flexural atopic dermatitis 08/05/2019   Hypertension    Mild intermittent asthma, uncomplicated [id number]    Family History  Problem Relation Age of Onset   Allergic rhinitis Father    Angioedema Neg Hx    Asthma Neg Hx    Atopy Neg Hx    Eczema Neg Hx    Immunodeficiency Neg Hx    Urticaria Neg Hx     Past Surgical History:  Procedure Laterality Date   APPENDECTOMY     ARTHROSCOPIC REPAIR ACL Right 2001   CHOLECYSTECTOMY     CYSTOSCOPY N/A 04/15/2021   Procedure: CYSTOSCOPY;  Surgeon: Cleon Gustin, MD;  Location: [facility];  Service: Urology;  Laterality: N/A;   FOOT SURGERY     KNEE ARTHROSCOPY     TRANSURETHRAL RESECTION OF PROSTATE N/A 04/15/2021   Procedure: UNROOFING PROSTATIC ABSCESS;  Surgeon: Cleon Gustin, MD;  Location: [facility];  Service: Urology;  Laterality: N/A;   Social History   Occupational History   Not on file  Tobacco Use   Smoking status: Former   Smokeless tobacco: Current    Types: Snuff  Vaping Use   Vaping Use: Never used  Substance and Sexual Activity   Alcohol use: Yes    Comment: occ.    Drug use: No   Sexual activity: Not on file

## 2022-09-28 ENCOUNTER — Other Ambulatory Visit: Payer: Managed Care, Other (non HMO)

## 2022-09-28 DIAGNOSIS — E291 Testicular hypofunction: Secondary | ICD-10-CM

## 2022-09-30 IMAGING — US US SCROTUM W/ DOPPLER COMPLETE
1 series · 14 of 25 positions shown · non-contrast
Comparison: None.

CLINICAL DATA: Pain in testicle, unspecified laterality/Pain in
left testicle

EXAM:
SCROTAL ULTRASOUND
DOPPLER ULTRASOUND OF THE TESTICLES
TECHNIQUE: Complete ultrasound examination of the testicles, epididymis, and
other scrotal structures was performed. Color and spectral Doppler
ultrasound were also utilized to evaluate blood flow to the
testicles.

[Series 1: us scrotum w/doppler · 14 of 90 slices shown]
[im 1/90]
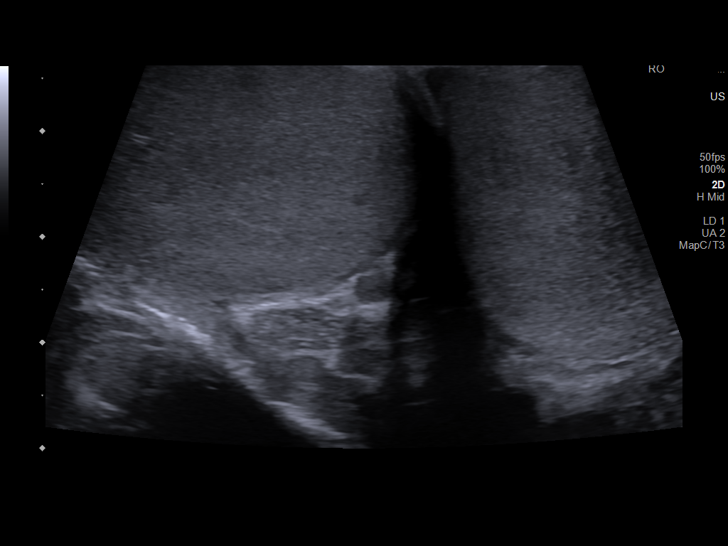
[im 8/90]
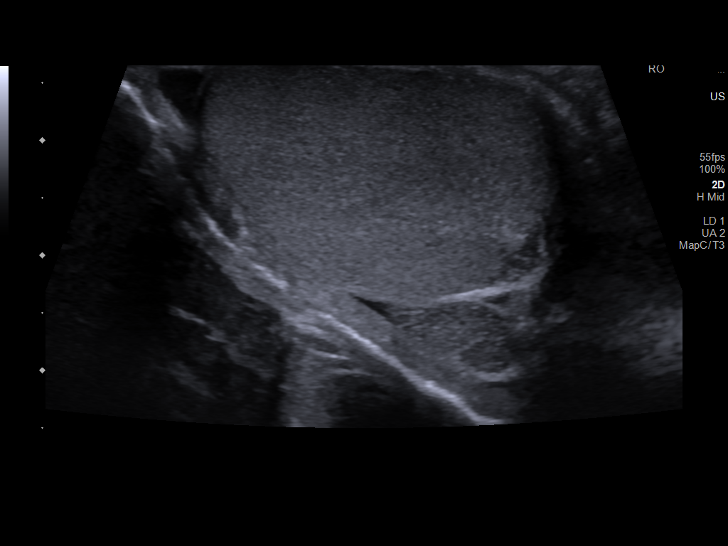
[im 15/90]
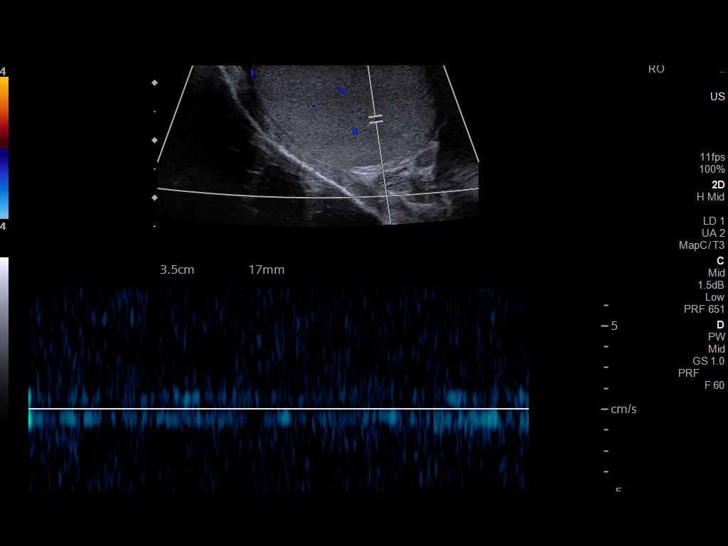
[im 23/90]
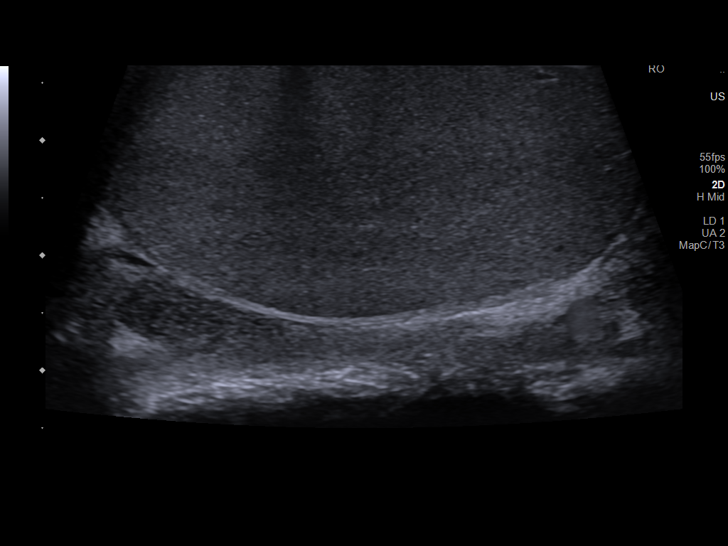
[im 30/90]
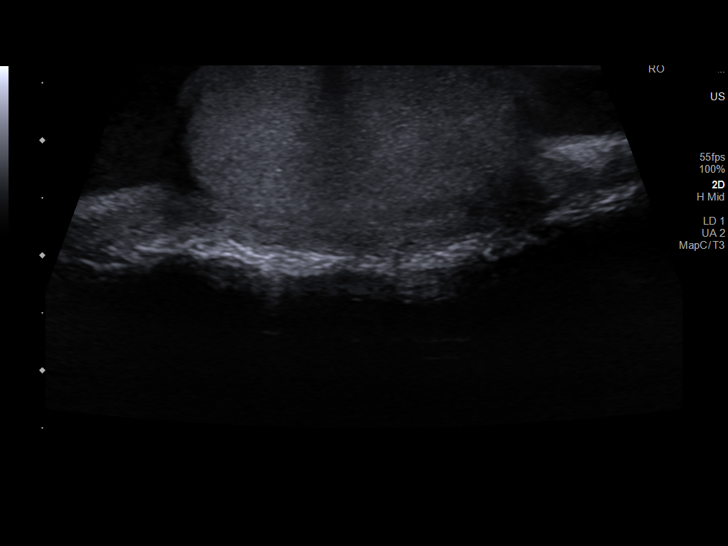
[im 34/90]
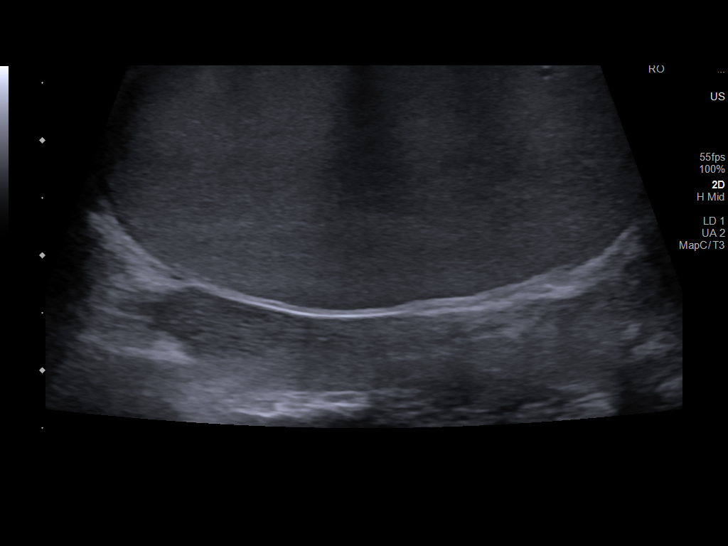
[im 41/90]
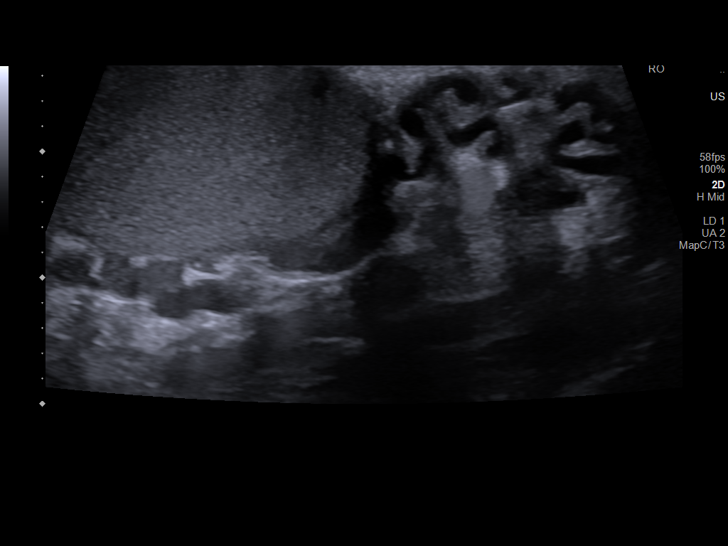
[im 49/90]
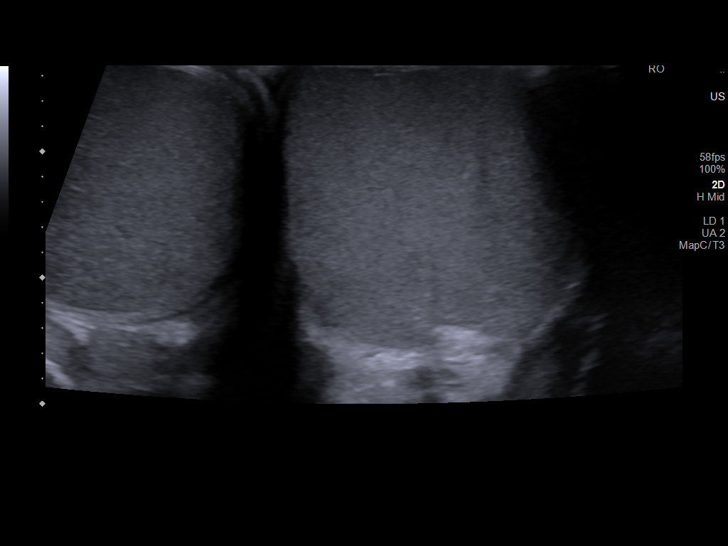
[im 56/90]
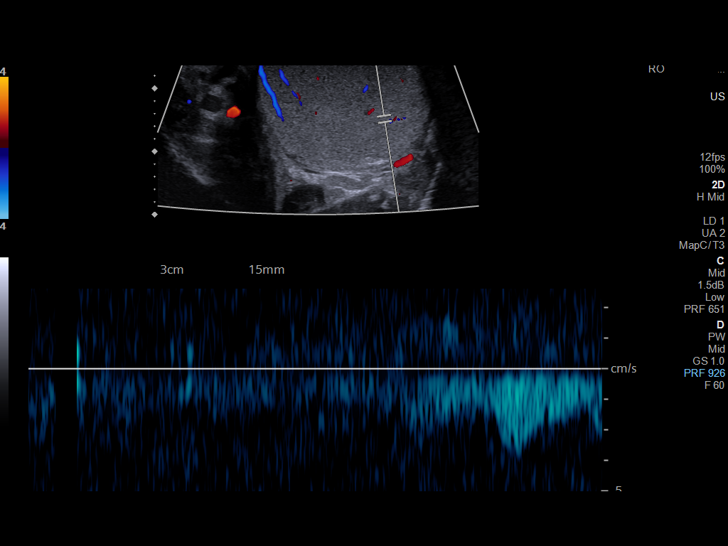
[im 60/90]
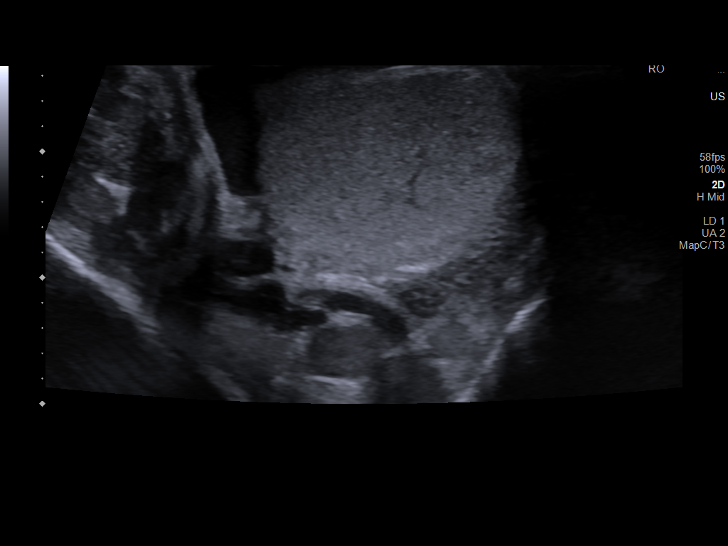
[im 67/90]
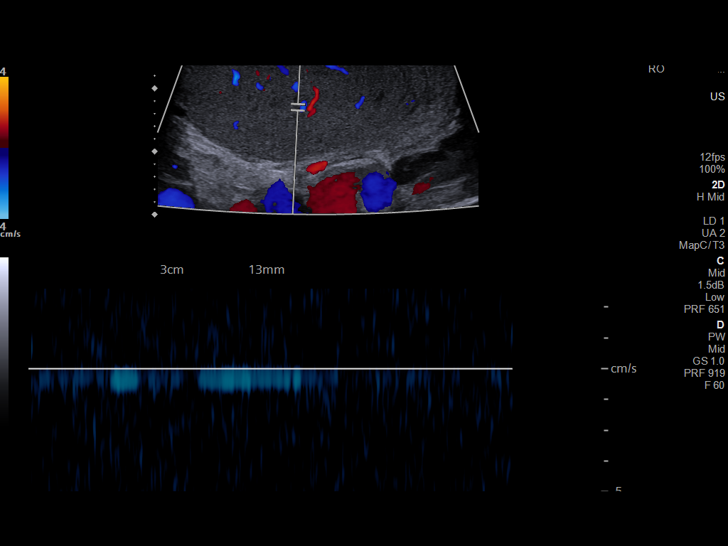
[im 75/90]
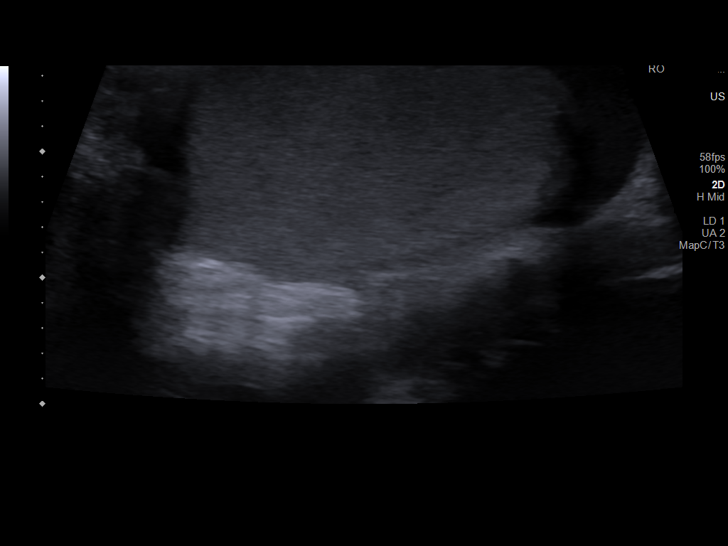
[im 82/90]
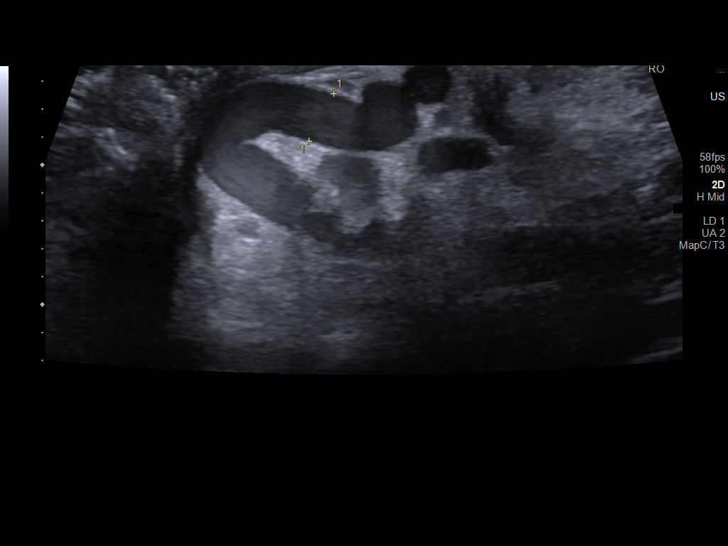
[im 90/90]
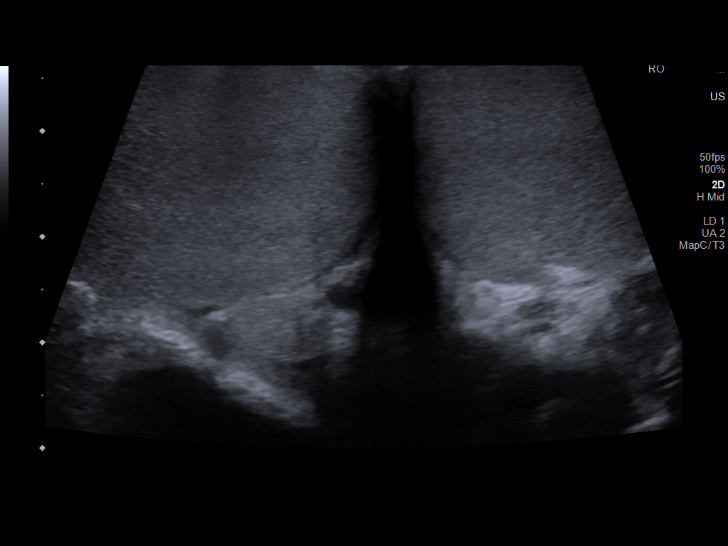

[14 of 25 positions shown; findings below may reference images not displayed]

FINDINGS: Right testicle

Measurements: 4.8 x 2.3 x 3.5 cm. No mass or microlithiasis
visualized.

Left testicle

Measurements: 4.4 x 1.9 x 2.4 cm. No mass or microlithiasis
visualized.

Right epididymis:  Normal in size and appearance.

Left epididymis:  Normal in size and appearance.

Hydrocele:  Trace hydroceles bilaterally.

Varicocele:  Left-sided varicocele measuring up to 4 mm.

Pulsed Doppler interrogation of both testes demonstrates normal low
resistance arterial and venous waveforms bilaterally.
IMPRESSION: Left-sided varicocele.  Trace bilateral hydroceles.

Normal testicles and epididymi.

## 2022-10-01 LAB — TESTOSTERONE,FREE AND TOTAL
Testosterone, Free: 13.1 pg/mL (ref 6.8–21.5)
Testosterone: 668 ng/dL (ref 264–916)

## 2022-10-02 IMAGING — DX DG ABDOMEN 1V
2 series · 2 of 2 positions shown · non-contrast
Comparison: None.

CLINICAL DATA: Nephrolithiasis.

EXAM:
ABDOMEN - 1 VIEW

[abdomen kub (1 of 2)]
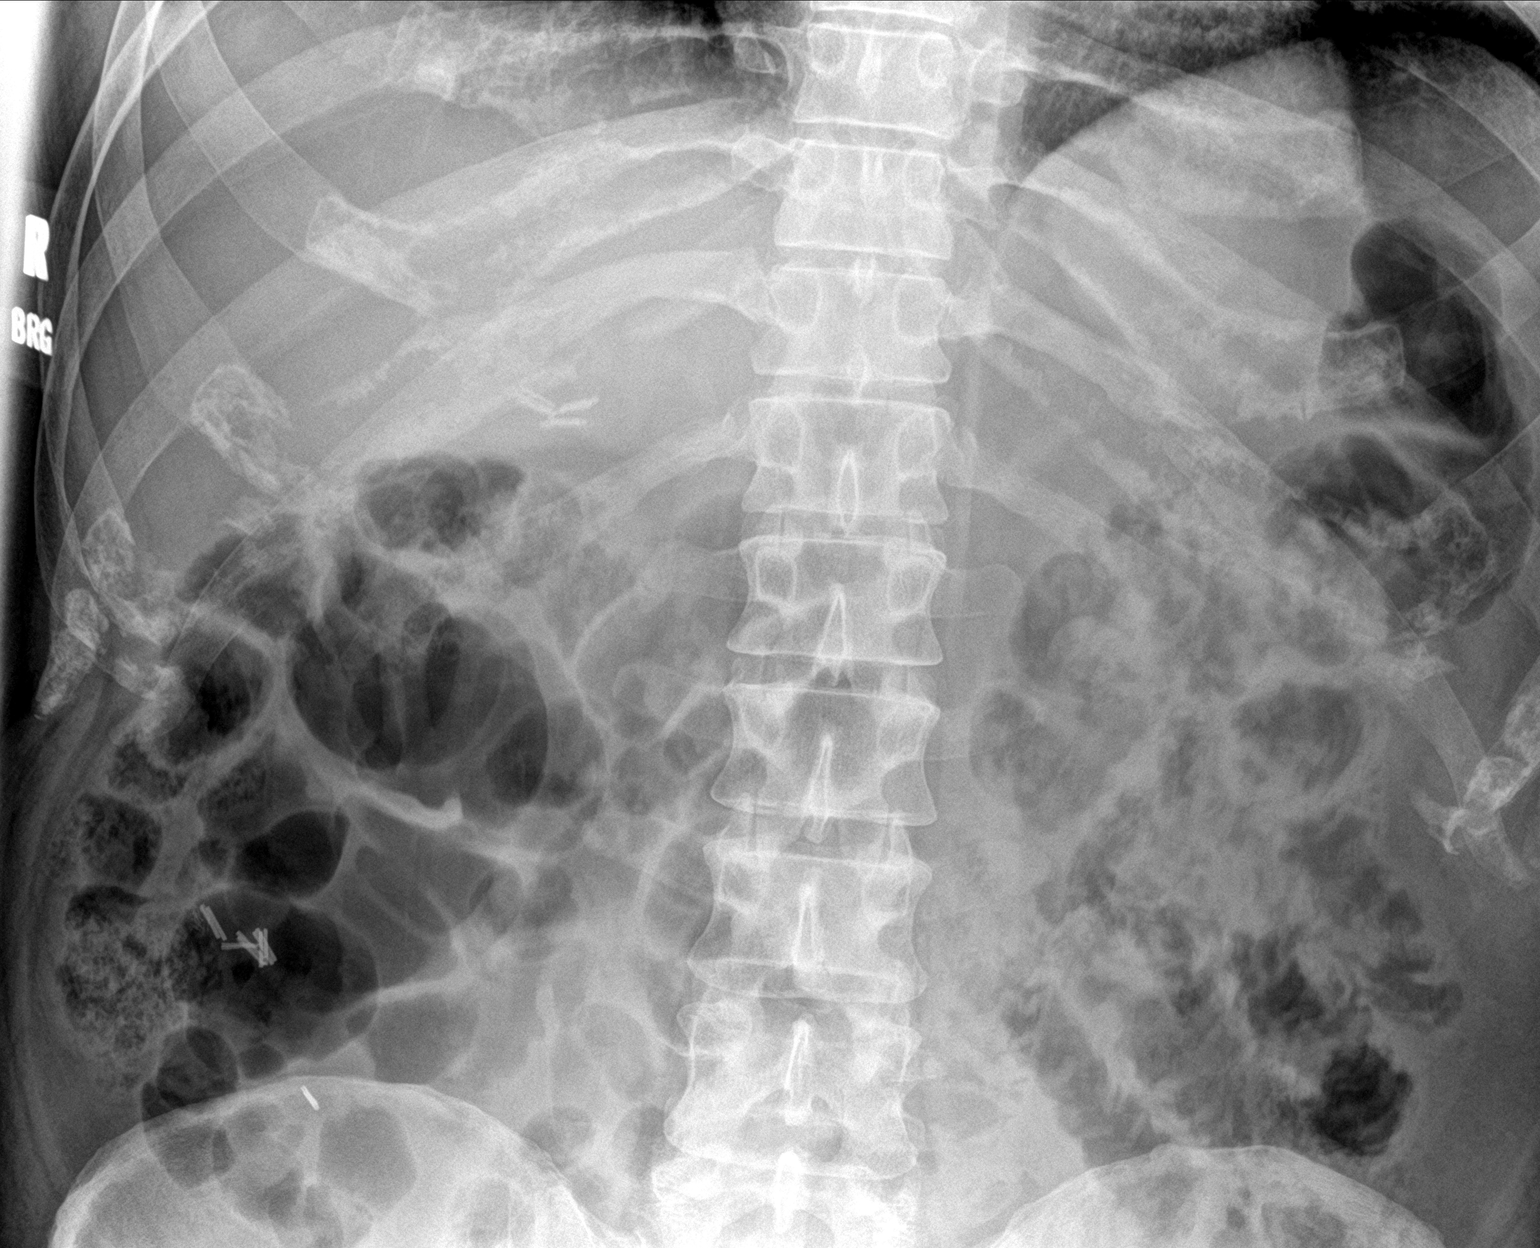

[abdomen kub (2 of 2)]
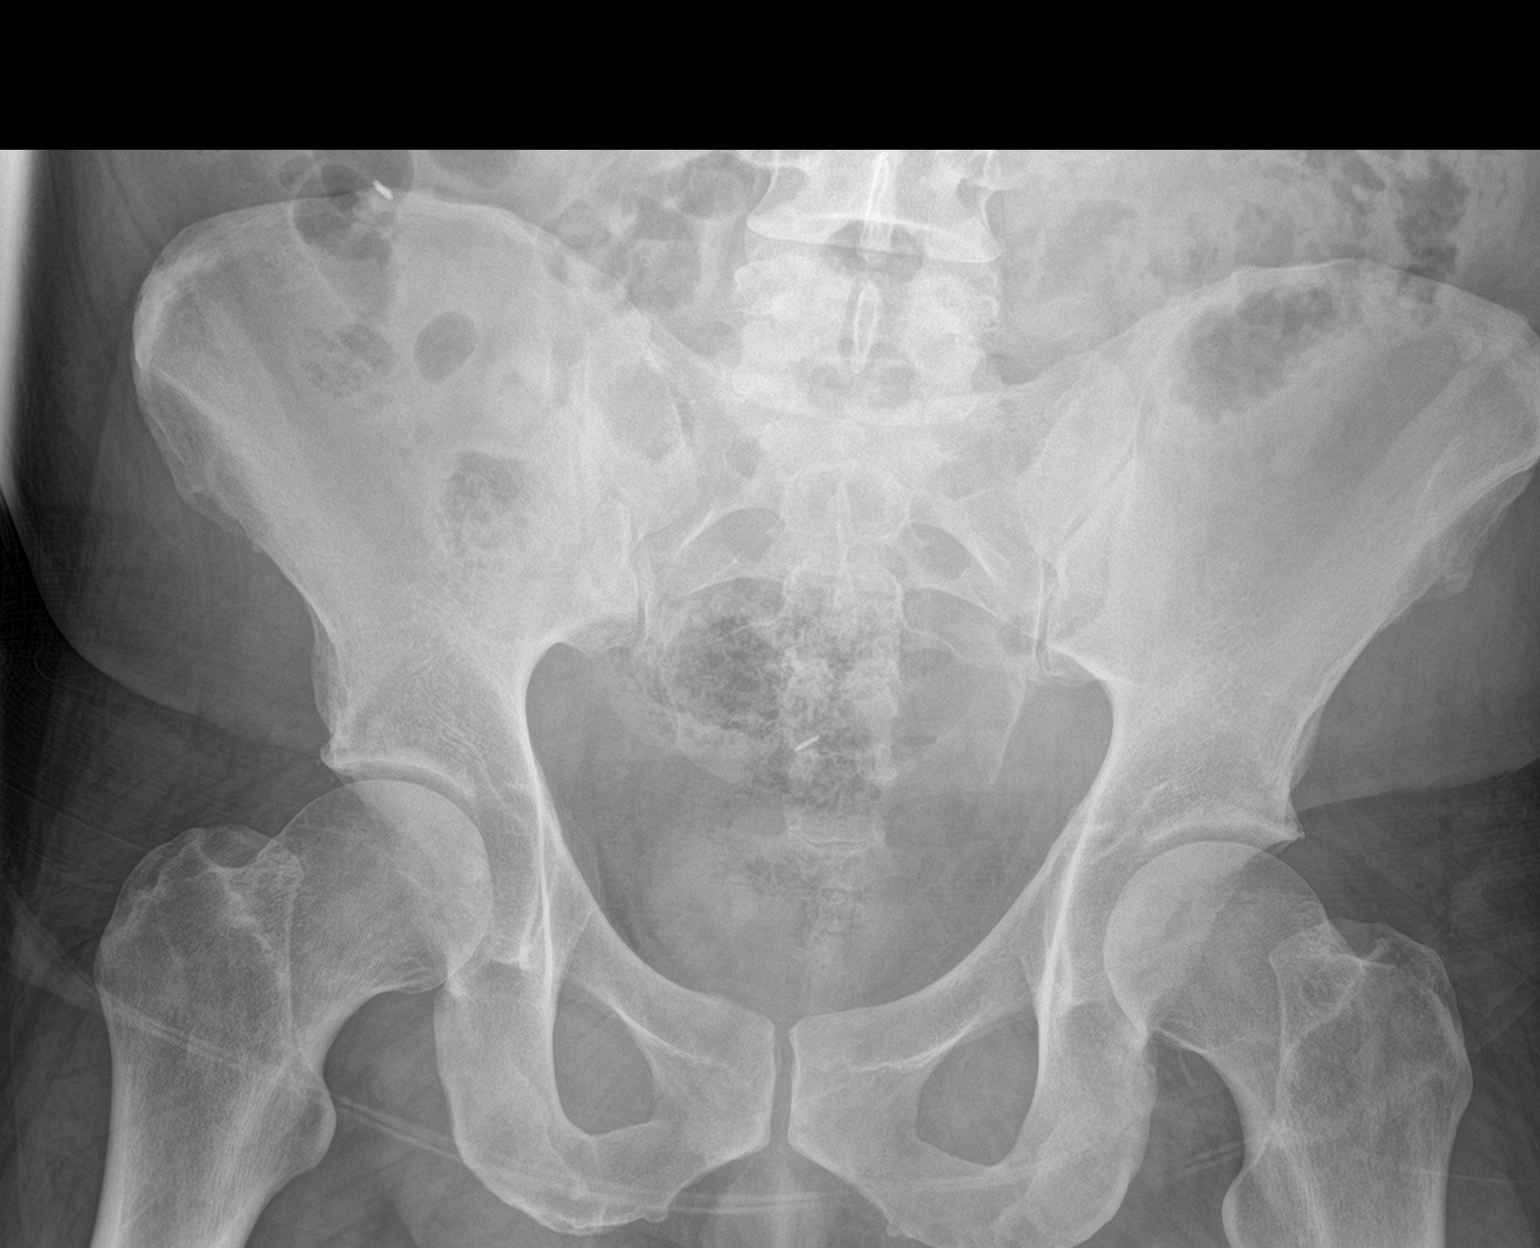

[2 of 2 positions shown; findings below may reference images not displayed]

FINDINGS: Both kidneys are largely obscured by bowel gas. No renal or ureteral
stones identified within this limitation. No other abnormalities.
IMPRESSION: Limited study as both kidneys are obscured by bowel gas. No renal or
ureteral stones noted.

## 2022-10-04 ENCOUNTER — Ambulatory Visit (INDEPENDENT_AMBULATORY_CARE_PROVIDER_SITE_OTHER): Payer: Managed Care, Other (non HMO) | Admitting: Urology

## 2022-10-04 VITALS — BP 116/77 | HR 83

## 2022-10-04 DIAGNOSIS — E291 Testicular hypofunction: Secondary | ICD-10-CM

## 2022-10-04 MED ORDER — CLOMIPHENE CITRATE 50 MG PO TABS
25.0000 mg | ORAL_TABLET | Freq: Every day | ORAL | 3 refills | Status: AC
Start: 2022-10-04 — End: ?

## 2022-10-04 NOTE — Progress Notes (Signed)
10/04/2022 3:36 PM   Raymond Sanchez Oct 31, 1975 [id number]  Referring provider: Lovey Newcomer, PA [address] [place],  [place] [address]  Followup hypogonadism   HPI: [name] is a 47yo here for followup for hypogonadism. Testosterone 668 on clomid 25mg  daily. Good energy. No fatigue./ Good libido. NO issues with erections. No other complaints today   PMH: Past Medical History:  Diagnosis Date   Diabetes mellitus without complication (HCC)    Flexural atopic dermatitis 08/05/2019   Hypertension    Mild intermittent asthma, uncomplicated 08/05/2019    Surgical History: Past Surgical History:  Procedure Laterality Date   APPENDECTOMY     ARTHROSCOPIC REPAIR ACL Right 2001   CHOLECYSTECTOMY     CYSTOSCOPY N/A 04/15/2021   Procedure: CYSTOSCOPY;  Surgeon: Malen Gauze, MD;  Location: [facility];  Service: Urology;  Laterality: N/A;   FOOT SURGERY     KNEE ARTHROSCOPY     TRANSURETHRAL RESECTION OF PROSTATE N/A 04/15/2021   Procedure: UNROOFING PROSTATIC ABSCESS;  Surgeon: Malen Gauze, MD;  Location: [facility];  Service: Urology;  Laterality: N/A;    Home Medications:  Allergies as of 10/04/2022   No Known Allergies      Medication List        Accurate as of October 04, 2022  3:36 PM. If you have any questions, ask your nurse or doctor.          albuterol 108 (90 Base) MCG/ACT inhaler Commonly known as: VENTOLIN HFA Inhale 1-2 puffs into the lungs every 6 (six) hours as needed for wheezing or shortness of breath.   clomiPHENE 50 MG tablet Commonly known as: CLOMID Take 0.5 tablets (25 mg total) by mouth daily. Refills pending follow up appointment.   docusate sodium 100 MG capsule Commonly known as: COLACE Take 1 capsule (100 mg total) by mouth 2 (two) times daily.   doxycycline 100 MG capsule Commonly known as: VIBRAMYCIN Take 1 capsule (100 mg total) by mouth every 12 (twelve) hours.   FreeStyle Libre 2 Sensor Misc REPLACE AS DIRECTED    levocetirizine 5 MG tablet Commonly known as: XYZAL Take 1 tablet (5 mg total) by mouth every evening.   montelukast 10 MG tablet Commonly known as: Singulair Take 1 tablet (10 mg total) by mouth at bedtime.   Mounjaro 2.5 MG/0.5ML Pen Generic drug: tirzepatide SMARTSIG:2.5 Milligram(s) SUB-Q Once a Week   Mounjaro 5 MG/0.5ML Pen Generic drug: tirzepatide Inject into the skin.   ondansetron 8 MG disintegrating tablet Commonly known as: ZOFRAN-ODT Take 1 tablet (8 mg total) by mouth every 8 (eight) hours as needed for nausea or vomiting.   sulfamethoxazole-trimethoprim 800-160 MG tablet Commonly known as: BACTRIM DS Take 1 tablet by mouth 2 (two) times daily.   tamsulosin 0.4 MG Caps capsule Commonly known as: FLOMAX Take 1 capsule (0.4 mg total) by mouth daily after supper.   Evaristo Bury FlexTouch 200 UNIT/ML FlexTouch Pen Generic drug: insulin degludec Inject 32 Units into the skin daily.        Allergies: No Known Allergies  Family History: Family History  Problem Relation Age of Onset   Allergic rhinitis Father    Angioedema Neg Hx    Asthma Neg Hx    Atopy Neg Hx    Eczema Neg Hx    Immunodeficiency Neg Hx    Urticaria Neg Hx     Social History:  reports that he has quit smoking. His smokeless tobacco use includes snuff. He reports  current alcohol use. He reports that he does not use drugs.  ROS: All other review of systems were reviewed and are negative except what is noted above in HPI  Physical Exam: BP 116/77   Pulse 83   Constitutional:  Alert and oriented, No acute distress. HEENT: [place] AT, moist mucus membranes.  Trachea midline, no masses. Cardiovascular: No clubbing, cyanosis, or edema. Respiratory: Normal respiratory effort, no increased work of breathing. GI: Abdomen is soft, nontender, nondistended, no abdominal masses GU: No CVA tenderness.  Lymph: No cervical or inguinal lymphadenopathy. Skin: No rashes, bruises or suspicious  lesions. Neurologic: Grossly intact, no focal deficits, moving all 4 extremities. Psychiatric: Normal mood and affect.  Laboratory Data: Lab Results  Component Value Date   WBC 5.7 02/01/2022   HGB 16.8 02/01/2022   HCT 50.1 02/01/2022   MCV 88 02/01/2022   PLT 239 02/01/2022    Lab Results  Component Value Date   CREATININE 0.97 02/01/2022    No results found for: "PSA"  Lab Results  Component Value Date   TESTOSTERONE 668 09/28/2022    Lab Results  Component Value Date   HGBA1C 10.4 (H) 04/15/2021    Urinalysis    Component Value Date/Time   COLORURINE YELLOW 04/15/2021 1002   APPEARANCEUR Clear 10/26/2021 1608   LABSPEC >1.046 (H) 04/15/2021 1002   PHURINE 5.0 04/15/2021 1002   GLUCOSEU 1+ (A) 10/26/2021 1608   HGBUR MODERATE (A) 04/15/2021 1002   BILIRUBINUR Negative 10/26/2021 1608   KETONESUR 80 (A) 04/15/2021 1002   PROTEINUR 1+ (A) 10/26/2021 1608   PROTEINUR 30 (A) 04/15/2021 1002   UROBILINOGEN 0.2 08/30/2014 1414   NITRITE Negative 10/26/2021 1608   NITRITE NEGATIVE 04/15/2021 1002   LEUKOCYTESUR Negative 10/26/2021 1608   LEUKOCYTESUR TRACE (A) 04/15/2021 1002    Lab Results  Component Value Date   LABMICR See below: 10/26/2021   WBCUA None seen 10/26/2021   LABEPIT None seen 10/26/2021   MUCUS Present 10/26/2021   BACTERIA Few 10/26/2021    Pertinent Imaging:  Results for orders placed in visit on 04/10/21  Abdomen 1 view (KUB)  Narrative CLINICAL DATA:  Nephrolithiasis.  EXAM: ABDOMEN - 1 VIEW  COMPARISON:  None.  FINDINGS: Both kidneys are largely obscured by bowel gas. No renal or ureteral stones identified within this limitation. No other abnormalities.  IMPRESSION: Limited study as both kidneys are obscured by bowel gas. No renal or ureteral stones noted.   Electronically Signed By: Gerome Sam III M.D. On: 04/11/2021 11:11  No results found for this or any previous visit.  No results found for this or any  previous visit.  No results found for this or any previous visit.  No results found for this or any previous visit.  No valid procedures specified. No results found for this or any previous visit.  Results for orders placed during the hospital encounter of 08/30/14  CT RENAL STONE STUDY  Narrative CLINICAL DATA:  Right flank pain starting today, status post appendectomy, status postcholecystectomy.  EXAM: CT ABDOMEN AND PELVIS WITHOUT CONTRAST  TECHNIQUE: Multidetector CT imaging of the abdomen and pelvis was performed following the standard protocol without IV contrast.  COMPARISON:  None.  FINDINGS: Sagittal images of the spine shows disc space flattening with vacuum disc phenomenon at L5-S1 level. The lung bases are unremarkable.  The patient is status postcholecystectomy. Unenhanced liver shows no biliary ductal dilatation. Unenhanced pancreas, spleen and adrenal glands are unremarkable. Unenhanced kidneys are symmetrical in size.  No aortic aneurysm. No nephrolithiasis. No hydronephrosis or hydroureter. No calcified ureteral calculi are noted.  No small bowel obstruction. No ascites or free air. No adenopathy. Moderate stool and gas noted in transverse colon right colon and descending colon.  There is a redundant descending colon and especially sigmoid colon. The sigmoid colon is looping in right mid abdomen moderate distended with gas. There is some narrowing of the lumen of proximal sigmoid colon in axial image 53 and 54 without definite evidence of colonic obstruction. Please note this circular segment of the colon is surrounding mild rotational pattern of mesenteric vessels as seen in axial image 54. There is no definite evidence of internal hernia or mesenteric edema or fluid. Clinical correlation is necessary. If the patient has persistent colic like abdominal pain follow-up enhanced study could be performed as clinically warranted.  IMPRESSION: 1. There  is no nephrolithiasis.  No hydronephrosis or hydroureter. 2. Status post appendectomy. 3. There is redundant sigmoid colon moderate distended with gas. There is circular short segment mild narrowing of the lumen in proximal sigmoid colon surrounding a rotational pattern of mesenteric vessels please see axial image 54. There is no definite evidence of colonic obstruction or mesenteric edema. No definite evidence of colonic volvulus. No definite evidence of internal hernia. If the patient has persistent colic like abdominal pain follow-up enhanced study with IV and oral contrast could be performed as clinically warranted. 4. No small bowel obstruction. These results were called by telephone at the time of interpretation on 08/30/2014 at 4:08 pm to Dr. Vanetta Mulders , who verbally acknowledged these results.   Electronically Signed By: Natasha Mead M.D. On: 08/30/2014 16:08   Assessment & Plan:    1. Hypogonadism in male -testosterone labs today. Followup 6 months with labs -continue clomid 25mg  daily   No follow-ups on file.  Wilkie Aye, MD  [facility]

## 2022-10-05 LAB — COMPREHENSIVE METABOLIC PANEL
ALT: 17 IU/L (ref 0–44)
AST: 17 IU/L (ref 0–40)
Albumin: 4.5 g/dL (ref 4.1–5.1)
Alkaline Phosphatase: 74 IU/L (ref 44–121)
BUN/Creatinine Ratio: 13 (ref 9–20)
BUN: 14 mg/dL (ref 6–24)
Bilirubin Total: 0.4 mg/dL (ref 0.0–1.2)
CO2: 25 mmol/L (ref 20–29)
Calcium: 9.6 mg/dL (ref 8.7–10.2)
Chloride: 102 mmol/L (ref 96–106)
Creatinine, Ser: 1.06 mg/dL (ref 0.76–1.27)
Globulin, Total: 2.6 g/dL (ref 1.5–4.5)
Glucose: 195 mg/dL — ABNORMAL HIGH (ref 70–99)
Potassium: 4.4 mmol/L (ref 3.5–5.2)
Sodium: 140 mmol/L (ref 134–144)
Total Protein: 7.1 g/dL (ref 6.0–8.5)
eGFR: 88 mL/min/{1.73_m2} (ref >59)

## 2022-10-05 LAB — CBC
Hematocrit: 51.8 % — ABNORMAL HIGH (ref 37.5–51.0)
Hemoglobin: 17 g/dL (ref 13.0–17.7)
MCH: 29.8 pg (ref 26.6–33.0)
MCHC: 32.8 g/dL (ref 31.5–35.7)
MCV: 91 fL (ref 79–97)
Platelets: 244 10*3/uL (ref 150–450)
RBC: 5.71 x10E6/uL (ref 4.14–5.80)
RDW: 12.7 % (ref 11.6–15.4)
WBC: 7.4 10*3/uL (ref 3.4–10.8)

## 2022-10-05 LAB — ESTRADIOL: Estradiol: 62.1 pg/mL — ABNORMAL HIGH (ref 7.6–42.6)

## 2022-10-10 ENCOUNTER — Encounter: Payer: Self-pay | Admitting: Urology

## 2022-10-10 NOTE — Patient Instructions (Signed)

## 2022-10-23 IMAGING — CT CT PELVIS W/ CM
2 of 4 series · 16 of 46 positions shown, 18 images · IV contrast (agent unspecified)
Comparison: 04/14/2021

CLINICAL DATA: Status post drainage of prostate abscess and TURP on
04/15/2021. Persistent urgency, burning, and gross hematuria.
Diabetes.

EXAM:
CT PELVIS WITH CONTRAST
TECHNIQUE: Multidetector CT imaging of the pelvis was performed using the
standard protocol following the bolus administration of intravenous
contrast.

[Series 4: coronal st · coronal · 0.62mm/px · 3 of 107 slices shown]
[im 36/107  soft-tissue]
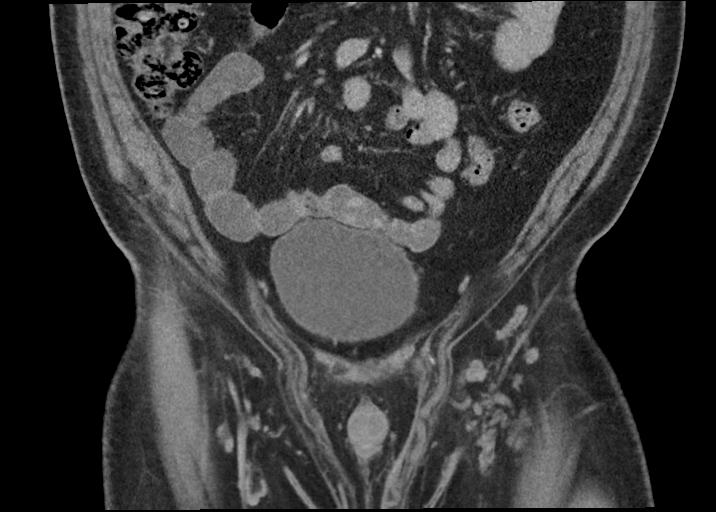
[im 48/107  soft-tissue]
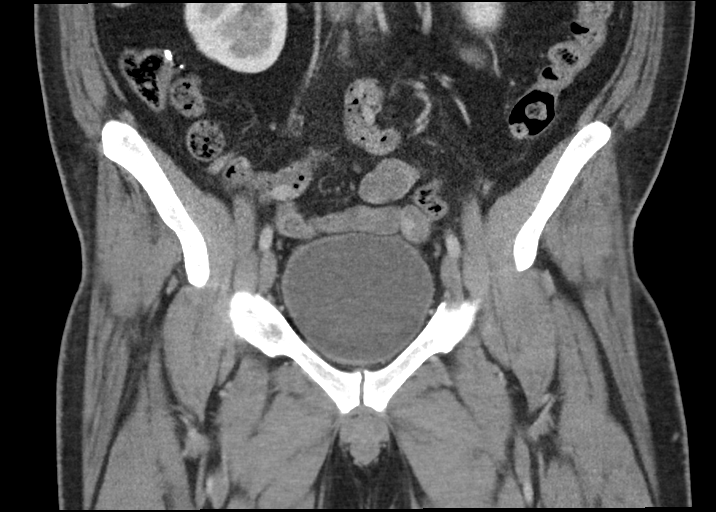
[im 59/107  soft-tissue]
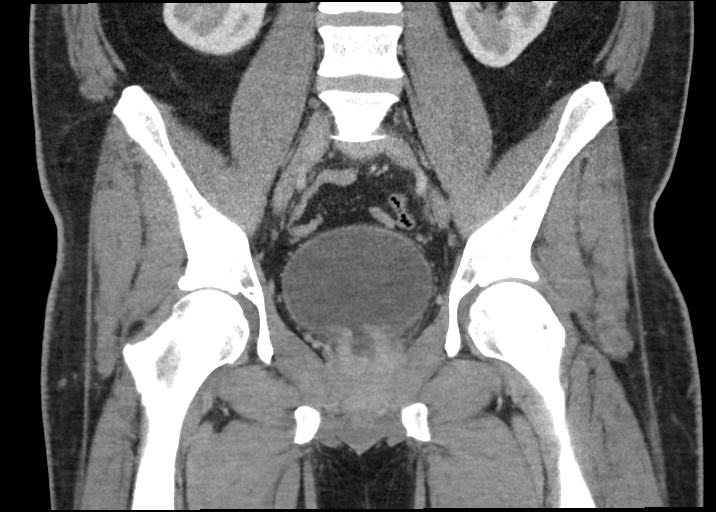

[Series 6: axial st · axial · 0.82mm/px · z∈[+725,+1020]mm · 13 of 69 slices shown, 15 images]
[im 5/69  soft-tissue]
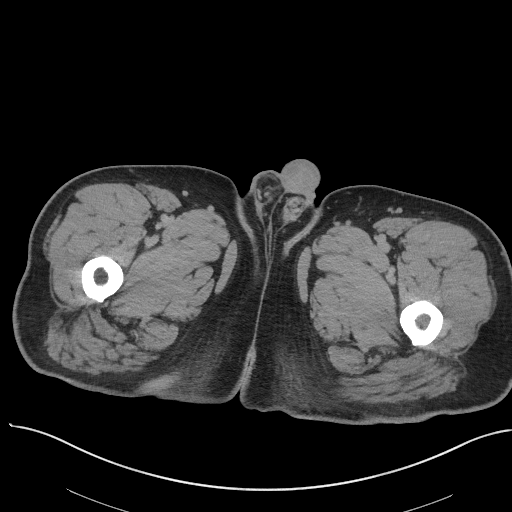
[im 5/69  bone]
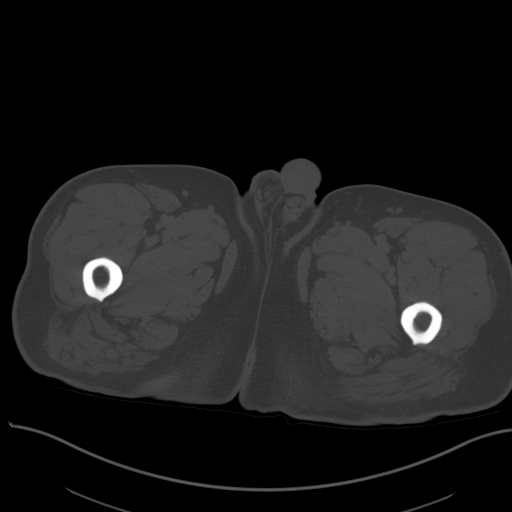
[im 9/69  soft-tissue]
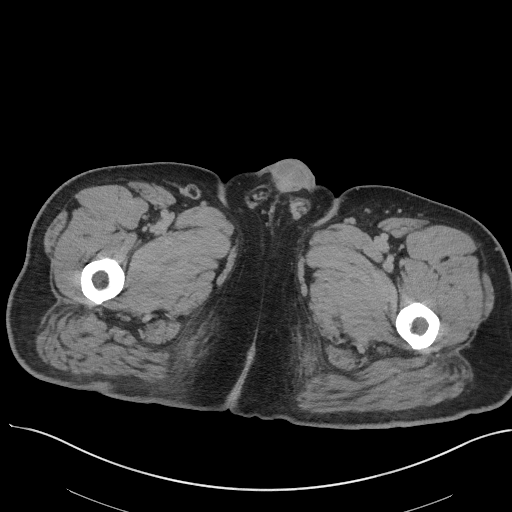
[im 13/69  soft-tissue]
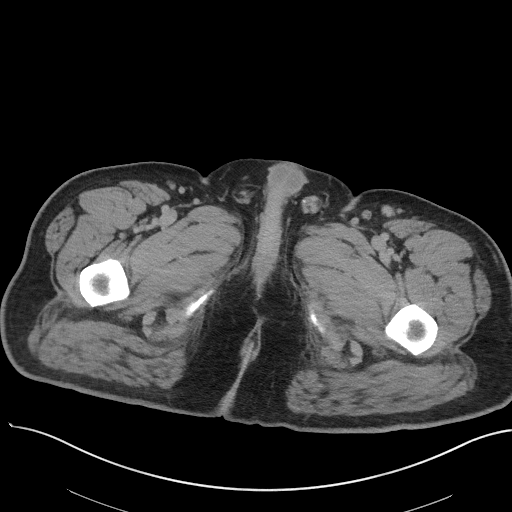
[im 22/69  soft-tissue]
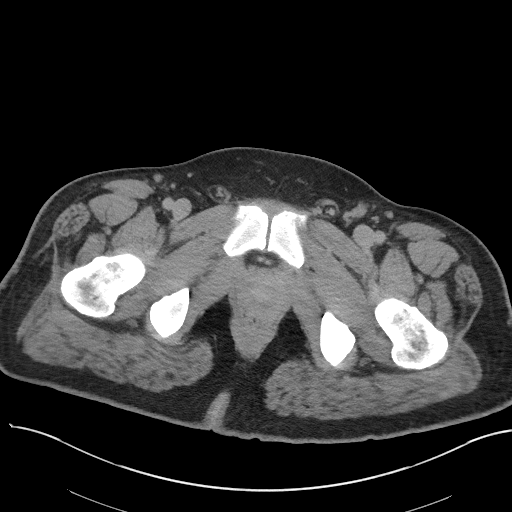
[im 26/69  soft-tissue]
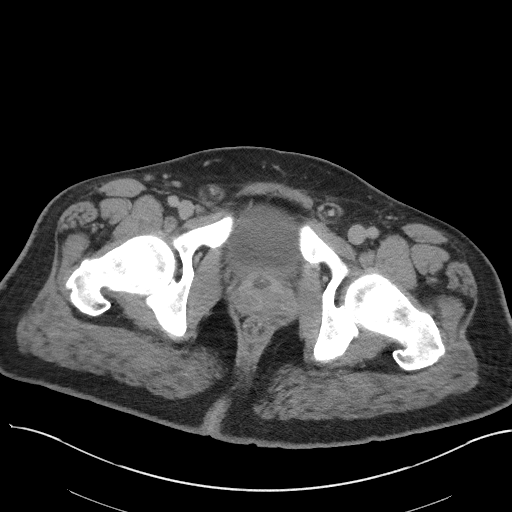
[im 30/69  soft-tissue]
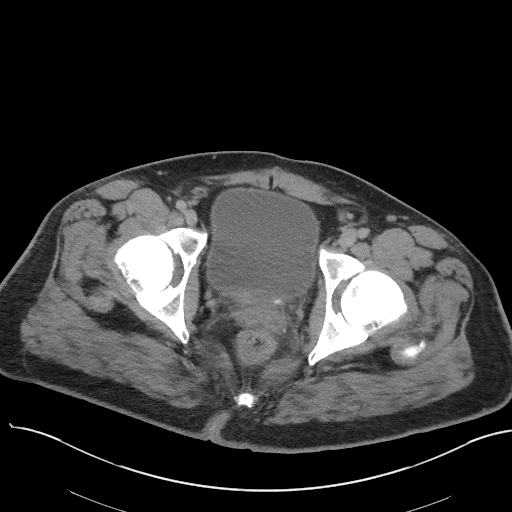
[im 35/69  soft-tissue]
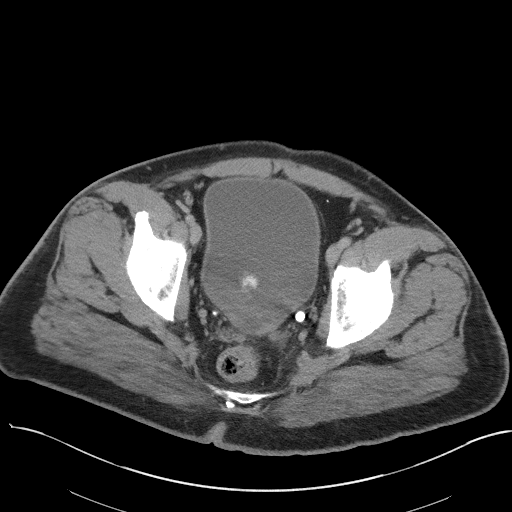
[im 39/69  soft-tissue]
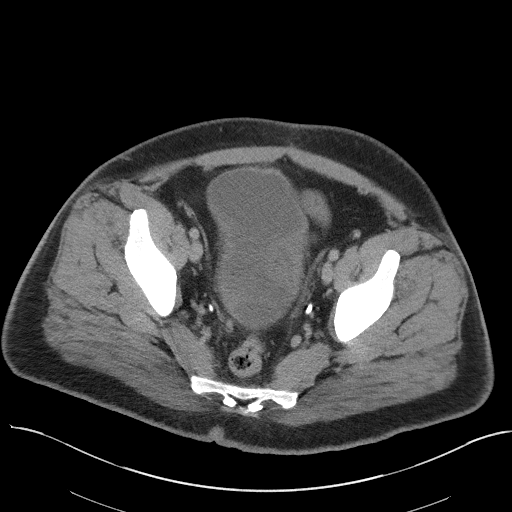
[im 43/69  soft-tissue]
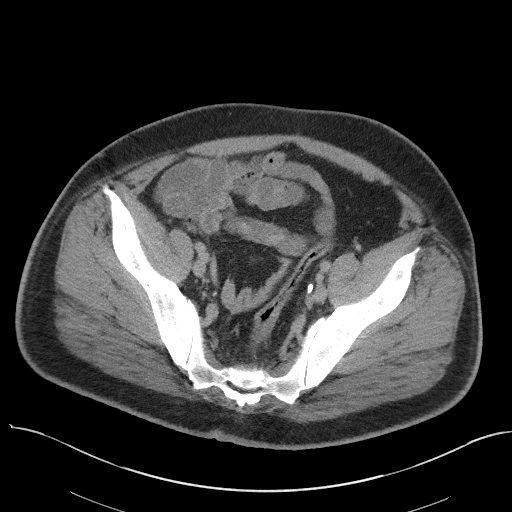
[im 43/69  bone]
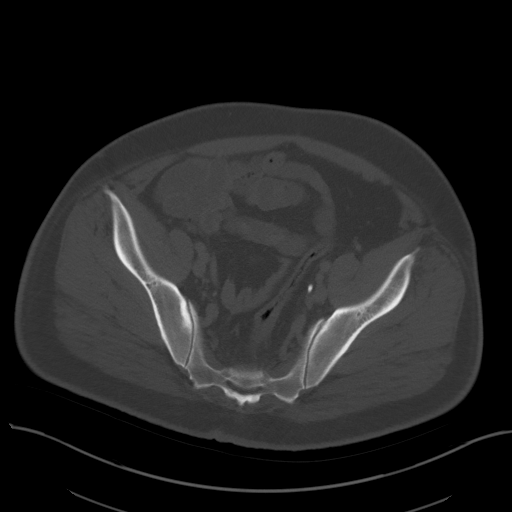
[im 47/69  soft-tissue]
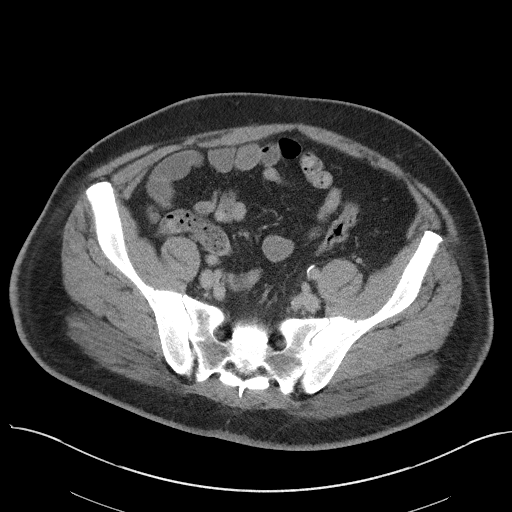
[im 56/69  soft-tissue]
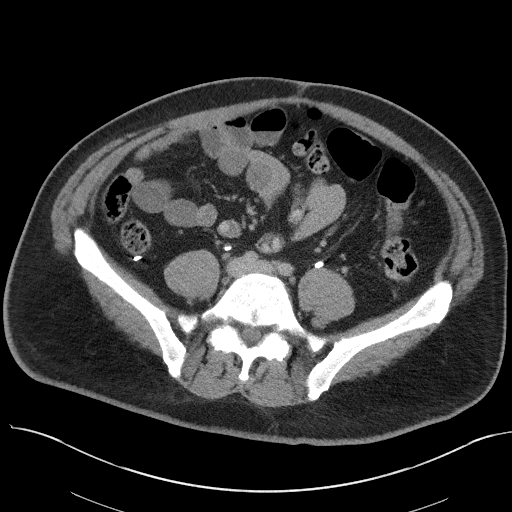
[im 60/69  soft-tissue]
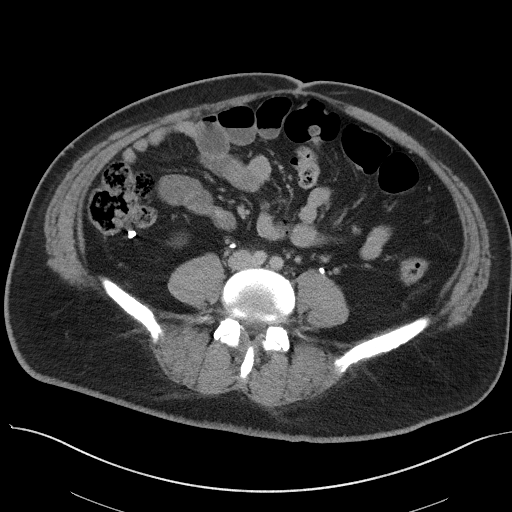
[im 64/69  soft-tissue]
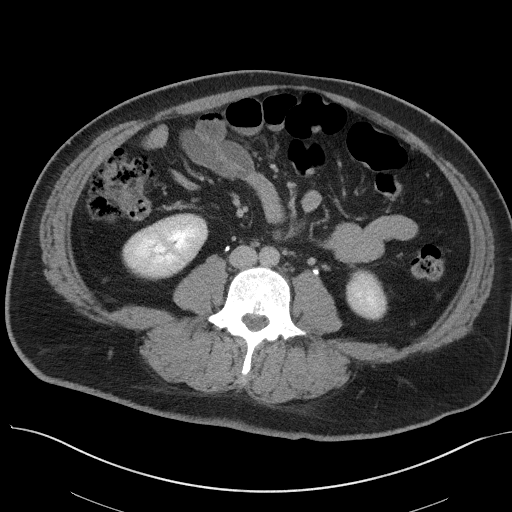

[16 of 46 positions shown; findings below may reference images not displayed]

RADIATION DOSE REDUCTION: This exam was performed according to the
departmental dose-optimization program which includes automated
exposure control, adjustment of the mA and/or kV according to
patient size and/or use of iterative reconstruction technique.

CONTRAST:  100mL OMNIPAQUE IOHEXOL 300 MG/ML  SOLN
FINDINGS: Urinary Tract: No distal hydroureter. Normal urinary bladder. Normal
imaged kidneys.

Bowel:  Appendectomy.  Normal colon.  Normal pelvic small bowel.

Vascular/Lymphatic: No pelvic sidewall aneurysm or adenopathy

Reproductive: Significantly improved appearance of the prostate.
Residual heterogeneity including vague hypoattenuation centered
about the anterior left prostate, including on 46/2. No well-defined
residual or recurrent drainable abscess. Concurrent presumed TURP
defect including on 44/2.

Other:  No significant free fluid.

Musculoskeletal: No acute osseous abnormality. Degenerative disc
disease at the lumbosacral junction.
IMPRESSION: Significantly improved appearance of the prostate. Residual
heterogeneity is likely a combination of the central TURP defect and
possible residual left-sided prostatitis. No evidence of residual or
recurrent drainable abscess.

## 2023-01-04 ENCOUNTER — Other Ambulatory Visit: Payer: Self-pay | Admitting: Urology

## 2023-01-04 NOTE — Telephone Encounter (Signed)
clomiPHENE (CLOMID) 50 MG tablet [[id number]]   Patient called this is too expensive for him to purchase , he needs a different medication , with his insurance its still $400

## 2023-01-05 NOTE — Telephone Encounter (Signed)
Patient left a voice message 01-04-2023.  Stating insurance is denying Rx.

## 2023-01-05 NOTE — Telephone Encounter (Signed)
Patient called to ask if a generic of Femara could be prescribed.  Insurance with cover the generic of Femara with a cost of $10.00 out of pocket.

## 2023-01-06 NOTE — Telephone Encounter (Signed)
Patient/wife was made aware that Prior authorization has been done but pending approval. Patient/wife is made aware a message will sent Dr. Ronne Binning on alternative medication. Patient/wife voiced understanding.

## 2023-01-06 NOTE — Telephone Encounter (Signed)
Patient wife calling upset no one has returned their call about the medication, this is unacceptable it has been 3 days.

## 2023-01-06 NOTE — Telephone Encounter (Signed)
PA started for Clomid Rx Key: [id number]- No PA needed for Serophene alternative.

## 2023-01-18 NOTE — Telephone Encounter (Signed)
Patient wife called again she is upset that no one is responding to them about  changing this medication

## 2023-01-18 NOTE — Addendum Note (Signed)
Addended by: Gustavus Messing on: 01/18/2023 04:35 PM   Modules accepted: Orders

## 2023-01-19 ENCOUNTER — Other Ambulatory Visit: Payer: Self-pay

## 2023-01-19 ENCOUNTER — Other Ambulatory Visit: Payer: Self-pay | Admitting: Urology

## 2023-01-19 MED ORDER — AMBULATORY NON FORMULARY MEDICATION: 1 refills | Status: AC

## 2023-01-19 MED ORDER — AMBULATORY NON FORMULARY MEDICATION: 25.0000 mg | Freq: Every day | 2 refills | Status: DC

## 2023-01-19 MED ORDER — AMBULATORY NON FORMULARY MEDICATION: 25.0000 mg | Freq: Every day | 2 refills | Status: AC

## 2023-01-19 NOTE — Addendum Note (Signed)
Addended by: Gustavus Messing on: 01/19/2023 09:14 AM   Modules accepted: Orders

## 2023-01-19 NOTE — Telephone Encounter (Signed)
Alternative sent in per Dr. Ronne Binning. Patient's called and made aware.

## 2023-03-22 ENCOUNTER — Other Ambulatory Visit: Payer: Managed Care, Other (non HMO)

## 2023-04-01 ENCOUNTER — Ambulatory Visit: Payer: Managed Care, Other (non HMO) | Admitting: Urology

## 2023-06-01 ENCOUNTER — Ambulatory Visit: Payer: Self-pay | Admitting: Urology

## 2023-09-02 ENCOUNTER — Ambulatory Visit: Payer: Self-pay | Admitting: Urology

## 2023-11-14 ENCOUNTER — Ambulatory Visit (HOSPITAL_BASED_OUTPATIENT_CLINIC_OR_DEPARTMENT_OTHER): Admitting: Physician Assistant

## 2023-11-14 ENCOUNTER — Ambulatory Visit (INDEPENDENT_AMBULATORY_CARE_PROVIDER_SITE_OTHER)

## 2023-11-14 ENCOUNTER — Other Ambulatory Visit (HOSPITAL_BASED_OUTPATIENT_CLINIC_OR_DEPARTMENT_OTHER): Payer: Self-pay | Admitting: Physician Assistant

## 2023-11-14 ENCOUNTER — Encounter (HOSPITAL_BASED_OUTPATIENT_CLINIC_OR_DEPARTMENT_OTHER): Payer: Self-pay | Admitting: Physician Assistant

## 2023-11-14 DIAGNOSIS — M5442 Lumbago with sciatica, left side: Secondary | ICD-10-CM

## 2023-11-14 MED ORDER — METHYLPREDNISOLONE 4 MG PO TBPK: ORAL_TABLET | ORAL | 0 refills | Status: AC

## 2023-11-14 MED ORDER — METHOCARBAMOL 500 MG PO TABS: 500.0000 mg | ORAL_TABLET | Freq: Four times a day (QID) | ORAL | 0 refills | Status: AC | PRN

## 2023-11-14 NOTE — Progress Notes (Signed)
 Office Visit Note   Patient: Raymond Sanchez           Date of Birth: Mar 30, 1976           MRN: [id number] Visit Date: 11/14/2023              Requested by: Jolee Elsie RAMAN, PA [address] [place],  [place] [address] PCP: Jolee Elsie RAMAN, [place]   Assessment & Plan: Visit Diagnoses:  1. Acute bilateral low back pain with left-sided sciatica     Plan: Patient is a 48 year old gentleman who comes in today complaining of low back pain radiating down his left leg.  This occurred about 6 days ago after he was driving his lawnmower and it went over a hole.  He has had difficulty since denies any loss of bowel or bladder control.  He is neurologically intact.  He is quite uncomfortable however.  I recommend a steroid Dosepak and a muscle relaxer.  Would also engage in physical therapy.  He would like to do this at our [place] office.  If he gets no better could consider an MRI follow-up in 2 weeks  Follow-Up Instructions: Return in about 2 weeks (around 11/28/2023).   Orders:  Orders Placed This Encounter  Procedures   Ambulatory referral to Physical Therapy   Meds ordered this encounter  Medications   methylPREDNISolone  (MEDROL  DOSEPAK) 4 MG TBPK tablet    Sig: Take as directed with food    Dispense:  21 tablet    Refill:  0   methocarbamol  (ROBAXIN ) 500 MG tablet    Sig: Take 1 tablet (500 mg total) by mouth every 6 (six) hours as needed for muscle spasms.    Dispense:  30 tablet    Refill:  0      Procedures: No procedures performed   Clinical Data: No additional findings.   Subjective: Chief Complaint  Patient presents with   Lower Back - Pain    HPI Patient is a 48 year old gentleman who comes in today complaining of low back pain radiating down his left leg.  He thinks about 6 days ago he hit a hole while mowing the yard when he jumped over the hole he had sharp pain to his back shooting down his leg he had to get help to get off the mower.  He has had some tingling and  burning from the low back down to the foot he has some stiffness he has taken meloxicam in the past from his primary care provider but was unavailable went to another provider and they gave him diclofenac which was not as effective.  No loss of bowel or bladder control Review of Systems  All other systems reviewed and are negative.    Objective: Vital Signs: There were no vitals taken for this visit.  Physical Exam Constitutional:      Appearance: Normal appearance.  Pulmonary:     Effort: Pulmonary effort is normal.  Skin:    General: Skin is warm and dry.  Neurological:     General: No focal deficit present.     Mental Status: He is alert and oriented to person, place, and time.  Psychiatric:        Mood and Affect: Mood normal.        Behavior: Behavior normal.     Ortho Exam Examination he is slow to go from sitting to standing secondary to pain.  He has pain in his mid lower back going down his  left lower leg.  He has good strength with resisted dorsiflexion plantarflexion of his ankles extension and flexion of his legs and hips.  No paresthesias.  No pain with manipulation of his hip Specialty Comments:  No specialty comments available.  Imaging: No results found.   PMFS History: Patient Active Problem List   Diagnosis Date Noted   Ejaculation, retrograde 05/06/2021   Gross hematuria 04/29/2021   Prostatic abscess 04/15/2021   Colitis 04/15/2021   Asthma 04/15/2021   Hyperglycemia due to diabetes mellitus (HCC) 04/15/2021   Prostate abscess 04/15/2021   Prostatitis 04/10/2021   Mild intermittent asthma, uncomplicated 08/05/2019   Seasonal and perennial allergic rhinitis 08/05/2019   Flexural atopic dermatitis 08/05/2019   Insect sting allergy  08/05/2019   Past Medical History:  Diagnosis Date   Diabetes mellitus without complication (HCC)    Flexural atopic dermatitis 08/05/2019   Hypertension    Mild intermittent asthma, uncomplicated 08/05/2019    Family  History  Problem Relation Age of Onset   Allergic rhinitis Father    Angioedema Neg Hx    Asthma Neg Hx    Atopy Neg Hx    Eczema Neg Hx    Immunodeficiency Neg Hx    Urticaria Neg Hx     Past Surgical History:  Procedure Laterality Date   APPENDECTOMY     ARTHROSCOPIC REPAIR ACL Right 2001   CHOLECYSTECTOMY     CYSTOSCOPY N/A 04/15/2021   Procedure: CYSTOSCOPY;  Surgeon: Sherrilee Belvie CROME, MD;  Location: [facility];  Service: Urology;  Laterality: N/A;   FOOT SURGERY     KNEE ARTHROSCOPY     TRANSURETHRAL RESECTION OF PROSTATE N/A 04/15/2021   Procedure: UNROOFING PROSTATIC ABSCESS;  Surgeon: Sherrilee Belvie CROME, MD;  Location: [facility];  Service: Urology;  Laterality: N/A;   Social History   Occupational History   Not on file  Tobacco Use   Smoking status: Former   Smokeless tobacco: Current    Types: Snuff  Vaping Use   Vaping status: Never Used  Substance and Sexual Activity   Alcohol  use: Yes    Comment: occ.    Drug use: No   Sexual activity: Not on file

## 2023-11-25 ENCOUNTER — Ambulatory Visit: Payer: Self-pay | Admitting: Urology

## 2023-11-30 ENCOUNTER — Ambulatory Visit (HOSPITAL_BASED_OUTPATIENT_CLINIC_OR_DEPARTMENT_OTHER): Admitting: Physician Assistant

## 2023-12-28 ENCOUNTER — Ambulatory Visit (HOSPITAL_COMMUNITY)

## 2023-12-28 NOTE — Therapy (Incomplete)
 OUTPATIENT PHYSICAL THERAPY THORACOLUMBAR EVALUATION   Patient Name: Raymond Sanchez MRN: [id number] DOB:1975/12/05, 48 y.o., male Today's Date: 12/28/2023  END OF SESSION:   Past Medical History:  Diagnosis Date   Diabetes mellitus without complication (HCC)    Flexural atopic dermatitis 08/05/2019   Hypertension    Mild intermittent asthma, uncomplicated 08/05/2019   Past Surgical History:  Procedure Laterality Date   APPENDECTOMY     ARTHROSCOPIC REPAIR ACL Right 2001   CHOLECYSTECTOMY     CYSTOSCOPY N/A 04/15/2021   Procedure: CYSTOSCOPY;  Surgeon: Sherrilee Belvie CROME, MD;  Location: [facility];  Service: Urology;  Laterality: N/A;   FOOT SURGERY     KNEE ARTHROSCOPY     TRANSURETHRAL RESECTION OF PROSTATE N/A 04/15/2021   Procedure: UNROOFING PROSTATIC ABSCESS;  Surgeon: Sherrilee Belvie CROME, MD;  Location: [facility];  Service: Urology;  Laterality: N/A;   Patient Active Problem List   Diagnosis Date Noted   Ejaculation, retrograde 05/06/2021   Gross hematuria 04/29/2021   Prostatic abscess 04/15/2021   Colitis 04/15/2021   Asthma 04/15/2021   Hyperglycemia due to diabetes mellitus (HCC) 04/15/2021   Prostate abscess 04/15/2021   Prostatitis 04/10/2021   Mild intermittent asthma, uncomplicated 08/05/2019   Seasonal and perennial allergic rhinitis 08/05/2019   Flexural atopic dermatitis 08/05/2019   Insect sting allergy  08/05/2019    PCP: Jolee Elsie RAMAN, PA   REFERRING PROVIDER: Persons, Ronal Dragon, PA  REFERRING DIAG: [phone] (ICD-10-CM) - Acute bilateral low back pain with left-sided sciatica  Rationale for Evaluation and Treatment: Rehabilitation  THERAPY DIAG:  No diagnosis found.  ONSET DATE: ***  SUBJECTIVE:                                                                                                                                                                                           SUBJECTIVE STATEMENT: ***  PERTINENT HISTORY:  ***  PAIN:  Are you  having pain? {OPRCPAIN:27236}  PRECAUTIONS: {Therapy precautions:24002}  RED FLAGS: {PT Red Flags:29287}   WEIGHT BEARING RESTRICTIONS: {Yes ***/No:24003}  FALLS:  Has patient fallen in last 6 months? {fallsyesno:27318}  LIVING ENVIRONMENT: Lives with: {OPRC lives with:25569::lives with their family} Lives in: {Lives in:25570} Stairs: {opstairs:27293} Has following equipment at home: {Assistive devices:23999}  OCCUPATION: ***  PLOF: {PLOF:24004}  PATIENT GOALS: ***  NEXT MD VISIT: ***  OBJECTIVE:  Note: Objective measures were completed at Evaluation unless otherwise noted.  DIAGNOSTIC FINDINGS:  ***  PATIENT SURVEYS:  {rehab surveys:24030}  COGNITION: Overall cognitive status: {cognition:24006}     SENSATION: {sensation:27233}  MUSCLE LENGTH: Hamstrings: Right *** deg; Left *** deg Debby test: Right ***  deg; Left *** deg  POSTURE: {posture:25561}  PALPATION: ***  LUMBAR ROM:   AROM eval  Flexion   Extension   Right lateral flexion   Left lateral flexion   Right rotation   Left rotation    (Blank rows = not tested)  LOWER EXTREMITY ROM:     {AROM/PROM:27142}  Right eval Left eval  Hip flexion    Hip extension    Hip abduction    Hip adduction    Hip internal rotation    Hip external rotation    Knee flexion    Knee extension    Ankle dorsiflexion    Ankle plantarflexion    Ankle inversion    Ankle eversion     (Blank rows = not tested)  LOWER EXTREMITY MMT:    MMT Right eval Left eval  Hip flexion    Hip extension    Hip abduction    Hip adduction    Hip internal rotation    Hip external rotation    Knee flexion    Knee extension    Ankle dorsiflexion    Ankle plantarflexion    Ankle inversion    Ankle eversion     (Blank rows = not tested)  LUMBAR SPECIAL TESTS:  Straight leg raise test: {pos/neg:25243} and Slump test: {pos/neg:25243}  FUNCTIONAL TESTS:  5 times sit to stand: *** 2 minute walk test:  ***  GAIT: Distance walked: *** Assistive device utilized: {Assistive devices:23999} Level of assistance: {Levels of assistance:24026} Comments: ***  TREATMENT DATE:  12/28/2023  Vitals: BP: HR: O2 sat: Evaluation: -ROM measured, Strength assessed, HEP prescribed, pt educated on prognosis, findings, and importance of HEP compliance if given.  Manual Therapy: -CPA of Lumbar Spinal segments L3-L5, grade II-III mobilizations -STM of Lumbar Paraspinal musculature  Therapeutic Exercise: -Supine bridges 2 sets of 10 reps, 3 second holds, symptomatic, pt cued for max hip extension -Standing 3 way hip 1 sets 10 reps, bilaterally, pt cued for upright trunk and maintaining of neutral spine -Lateral stepping 3 laps 20 feet per lap, second 2 with RTB around ankles, pt cued for upright posture -Forward lunges, 1 set of 5 reps better performance going into RLE, pt cued for core activation and upright posture                                                                                                                                    PATIENT EDUCATION:  Education details: Pt was educated on findings of PT evaluation, prognosis, frequency of therapy visits and rationale, attendance policy, and HEP if given.   Person educated: {Person educated:25204} Education method: {Education Method:25205} Education comprehension: {Education Comprehension:25206}  HOME EXERCISE PROGRAM: ***  ASSESSMENT:  CLINICAL IMPRESSION: Patient is a 48 y.o. male who was seen today for physical therapy evaluation and treatment for M54.42 (ICD-10-CM) - Acute bilateral low back pain with left-sided sciatica.   Patient demonstrates decreased LE  strength, abnormal pain rating, and impaired balance. Patient also demonstrates difficulty with ambulation during today's session with decreased stride length and velocity noted. Patient also demonstrates ***. Patient requires ***. Patient would benefit from skilled physical  therapy for increased endurance with ambulation, increased LE strength, and balance for improved gait quality, return to higher level of function with ADLs, and progress towards therapy goals.   OBJECTIVE IMPAIRMENTS: {opptimpairments:25111}.   ACTIVITY LIMITATIONS: {activitylimitations:27494}  PARTICIPATION LIMITATIONS: {participationrestrictions:25113}  PERSONAL FACTORS: {Personal factors:25162} are also affecting patient's functional outcome.   REHAB POTENTIAL: {rehabpotential:25112}  CLINICAL DECISION MAKING: {clinical decision making:25114}  EVALUATION COMPLEXITY: {Evaluation complexity:25115}   GOALS: Goals reviewed with patient? {yes/no:20286}  SHORT TERM GOALS: Target date: ***  Pt will be independent with HEP in order to demonstrate participation in Physical Therapy POC.  Baseline: Goal status: {GOALSTATUS:25110}  2.  Pt will report ***/10 pain with mobility in order to demonstrate improved pain with ADLs.  Baseline:  Goal status: {GOALSTATUS:25110}  LONG TERM GOALS: Target date: ***  Pt will improve *** by *** in order to demonstrate improved functional strength to return to desired activities.  Baseline: see objective.  Goal status: {GOALSTATUS:25110}  2.  Pt will improve 2 MWT by *** in order to demonstrate improved functional ambulatory capacity in community setting.  Baseline: see objective.  Goal status: {GOALSTATUS:25110}  3.  Pt will improve Modified Oswestry score by *** in order to demonstrate improved pain with functional goals and outcomes. Baseline: see objective.  Goal status: {GOALSTATUS:25110}  4.  Pt will report ***/10 pain with mobility in order to demonstrate reduced pain with ADLs lasting greater than 30 minutes.  Baseline: see objective.  Goal status: {GOALSTATUS:25110}   PLAN:  PT FREQUENCY: {rehab frequency:25116}  PT DURATION: {rehab duration:25117}  PLANNED INTERVENTIONS: {rehab planned interventions:25118::97110-Therapeutic  exercises,97530- Therapeutic [phone]- Neuromuscular re-education,97535- Self Rjmz,02859- Manual therapy,Patient/Family education}.  PLAN FOR NEXT SESSION: ***   Lang Ada, PT, DPT [facility] Office: [phone] 8:05 AM, 12/28/23

## 2023-12-30 ENCOUNTER — Telehealth (HOSPITAL_COMMUNITY): Payer: Self-pay
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
## 2024-01-23 ENCOUNTER — Other Ambulatory Visit: Payer: Self-pay

## 2024-02-07 ENCOUNTER — Other Ambulatory Visit: Payer: Self-pay

## 2024-02-07 DIAGNOSIS — E291 Testicular hypofunction: Secondary | ICD-10-CM

## 2024-02-08 ENCOUNTER — Ambulatory Visit: Payer: Self-pay | Admitting: Urology

## 2024-02-13 ENCOUNTER — Other Ambulatory Visit (HOSPITAL_BASED_OUTPATIENT_CLINIC_OR_DEPARTMENT_OTHER): Payer: Self-pay

## 2024-02-13 ENCOUNTER — Encounter: Payer: Self-pay | Admitting: Radiology

## 2024-02-13 ENCOUNTER — Other Ambulatory Visit: Payer: Self-pay

## 2024-02-13 ENCOUNTER — Encounter (HOSPITAL_BASED_OUTPATIENT_CLINIC_OR_DEPARTMENT_OTHER): Payer: Self-pay

## 2024-02-13 ENCOUNTER — Emergency Department (HOSPITAL_BASED_OUTPATIENT_CLINIC_OR_DEPARTMENT_OTHER)
Admission: EM | Admit: 2024-02-13 | Discharge: 2024-02-13 | Disposition: A | Discharge status: Home / Self Care | Attending: Emergency Medicine | Admitting: Emergency Medicine

## 2024-02-13 DIAGNOSIS — Z7984 Long term (current) use of oral hypoglycemic drugs: Secondary | ICD-10-CM | POA: Insufficient documentation

## 2024-02-13 DIAGNOSIS — Z79899 Other long term (current) drug therapy: Secondary | ICD-10-CM | POA: Diagnosis not present

## 2024-02-13 DIAGNOSIS — E119 Type 2 diabetes mellitus without complications: Secondary | ICD-10-CM | POA: Insufficient documentation

## 2024-02-13 DIAGNOSIS — I10 Essential (primary) hypertension: Secondary | ICD-10-CM | POA: Insufficient documentation

## 2024-02-13 DIAGNOSIS — M7989 Other specified soft tissue disorders: Secondary | ICD-10-CM | POA: Diagnosis present

## 2024-02-13 DIAGNOSIS — L02416 Cutaneous abscess of left lower limb: Secondary | ICD-10-CM | POA: Insufficient documentation

## 2024-02-13 MED ORDER — DOXYCYCLINE HYCLATE 100 MG PO CAPS
100.0000 mg | ORAL_CAPSULE | Freq: Two times a day (BID) | ORAL | 0 refills | Status: AC
  Filled 2024-02-13: qty 14, 7d supply, fill #0

## 2024-02-13 NOTE — ED Provider Notes (Addendum)
 [place] EMERGENCY DEPARTMENT AT [facility] Provider Note   CSN: [id number] Arrival date & time: 02/13/24  9243     Patient presents with: Leg Swelling   Raymond Sanchez is a 48 y.o. male.   Patient with about a 1 week history of an area of swelling and tenderness posterior very proximal left thigh.  No leg swelling.  Has a history varicose veins.  Patient states she has had some swelling in this area before.  No scrotal swelling.  Past medical history is significant for hypertension diabetes.  Patient does not have any allergies to antibiotics.  Patient denies any drainage.       Prior to Admission medications   Medication Sig Start Date End Date Taking? Authorizing Provider  albuterol  (VENTOLIN  HFA) 108 (90 Base) MCG/ACT inhaler Inhale 1-2 puffs into the lungs every 6 (six) hours as needed for wheezing or shortness of breath. 03/28/21   [provider]  AMBULATORY NON FORMULARY MEDICATION Take 25 mg by mouth daily. Serophene  01/19/23   Sherrilee Belvie CROME, MD  AMBULATORY NON FORMULARY MEDICATION Medication Name: Serophene  50mg  Take half of tablet (25mg ) by mouth daily 01/19/23   McKenzie, Belvie CROME, MD  clomiPHENE  (CLOMID ) 50 MG tablet Take 0.5 tablets (25 mg total) by mouth daily. Refills pending follow up appointment. 10/04/22   McKenzie, Belvie CROME, MD  Continuous Blood Gluc Sensor (FREESTYLE LIBRE 2 SENSOR) MISC REPLACE AS DIRECTED 04/23/21   [provider]  docusate sodium  (COLACE) 100 MG capsule Take 1 capsule (100 mg total) by mouth 2 (two) times daily. Patient not taking: Reported on 10/26/2021 04/17/21   Ricky Fines, MD  doxycycline  (VIBRAMYCIN ) 100 MG capsule Take 1 capsule (100 mg total) by mouth every 12 (twelve) hours. Patient not taking: Reported on 10/26/2021 05/01/21   Sherrilee Belvie CROME, MD  levocetirizine (XYZAL ) 5 MG tablet Take 1 tablet (5 mg total) by mouth every evening. 08/03/19   Iva Marty Saltness, MD  methocarbamol  (ROBAXIN ) 500  MG tablet Take 1 tablet (500 mg total) by mouth every 6 (six) hours as needed for muscle spasms. 11/14/23   Persons, Ronal Dragon, PA  methylPREDNISolone  (MEDROL  DOSEPAK) 4 MG TBPK tablet Take as directed with food 11/14/23   Persons, Ronal Dragon, PA  montelukast  (SINGULAIR ) 10 MG tablet Take 1 tablet (10 mg total) by mouth at bedtime. 08/03/19   Iva Marty Saltness, MD  MOUNJARO 2.5 MG/0.5ML Pen SMARTSIG:2.5 Milligram(s) SUB-Q Once a Week 04/27/21   [provider]  MOUNJARO 5 MG/0.5ML Pen Inject into the skin. Patient not taking: Reported on 10/04/2022 07/20/21   [provider]  ondansetron  (ZOFRAN -ODT) 8 MG disintegrating tablet Take 1 tablet (8 mg total) by mouth every 8 (eight) hours as needed for nausea or vomiting. Patient not taking: Reported on 10/26/2021 04/17/21   Ricky Fines, MD  sulfamethoxazole -trimethoprim  (BACTRIM  DS) 800-160 MG tablet Take 1 tablet by mouth 2 (two) times daily. Patient not taking: Reported on 10/26/2021 04/17/21   Ricky Fines, MD  tamsulosin  (FLOMAX ) 0.4 MG CAPS capsule Take 1 capsule (0.4 mg total) by mouth daily after supper. Patient not taking: Reported on 10/26/2021 04/10/21   Sherrilee Belvie CROME, MD  TRESIBA FLEXTOUCH 200 UNIT/ML FlexTouch Pen Inject 32 Units into the skin daily. 04/06/21   [provider]    Allergies: Patient has no known allergies.    Review of Systems  Constitutional:  Negative for chills and fever.  HENT:  Negative for ear pain and sore throat.  Eyes:  Negative for pain and visual disturbance.  Respiratory:  Negative for cough and shortness of breath.   Cardiovascular:  Negative for chest pain and palpitations.  Gastrointestinal:  Negative for abdominal pain and vomiting.  Genitourinary:  Negative for dysuria, hematuria, scrotal swelling and testicular pain.  Musculoskeletal:  Negative for arthralgias and back pain.  Skin:  Negative for color change and rash.  Neurological:  Negative for seizures and syncope.   All other systems reviewed and are negative.   Updated Vital Signs BP (!) 180/98   Pulse (!) 110   Temp 98.2 F (36.8 C) (Oral)   Resp 18   SpO2 100%   Physical Exam Vitals and nursing note reviewed.  Constitutional:      General: He is not in acute distress.    Appearance: Normal appearance. He is well-developed. He is not ill-appearing.  HENT:     Head: Normocephalic and atraumatic.     Mouth/Throat:     Mouth: Mucous membranes are moist.  Eyes:     Extraocular Movements: Extraocular movements intact.     Conjunctiva/sclera: Conjunctivae normal.     Pupils: Pupils are equal, round, and reactive to light.  Cardiovascular:     Rate and Rhythm: Normal rate and regular rhythm.     Heart sounds: No murmur heard. Pulmonary:     Effort: Pulmonary effort is normal. No respiratory distress.     Breath sounds: Normal breath sounds.  Abdominal:     Palpations: Abdomen is soft.     Tenderness: There is no abdominal tenderness.  Genitourinary:    Comments: No scrotal involvement. Musculoskeletal:        General: Swelling and tenderness present.     Cervical back: Normal range of motion and neck supple.     Comments: Left proximal posterior thigh with an area of induration measuring about 4 cm.  An area of fluctuance measuring about 5 mm.  But does not seem to be deep.  Some erythema associated with it.  No red streaking.  No scrotal involvement.  Skin:    General: Skin is warm and dry.     Capillary Refill: Capillary refill takes less than 2 seconds.  Neurological:     General: No focal deficit present.     Mental Status: He is alert and oriented to person, place, and time.  Psychiatric:        Mood and Affect: Mood normal.     (all labs ordered are listed, but only abnormal results are displayed) Labs Reviewed - No data to display  EKG: None  Radiology: No results found.   Procedures   Medications Ordered in the ED - No data to display                                   Medical Decision Making Risk Prescription drug management.   Left proximal posterior thigh with a superficial abscess.  No scrotal involvement.  Would not recommend I&D at this time would continue warm compresses.  Because the fluctuant area is very small.  Will start on antibiotics.  Have him return for anything new or worse particular if there is any swelling in the scrotum that he needs to get seen immediately.  I do not feel that this is vein related.  Final diagnoses:  Abscess of left thigh    ED Discharge Orders     None  Margarett Viti, MD 02/13/24 0900    Glade Strausser, MD 02/13/24 [phone]

## 2024-02-13 NOTE — Discharge Instructions (Addendum)
 Do not recommend incision and drainage at this point in time.  Will treat with doxycycline  continue warm compresses the abscess currently is very superficial and fairly small.  There is a large area of induration.  Work note provided.  If you get any kind of scrotal swelling at all we need to get seen right away.  If the abscess gets significantly better incision and drainage may be appropriate.  Hopefully that will not be required.

## 2024-02-13 NOTE — ED Notes (Signed)

## 2024-02-13 NOTE — ED Triage Notes (Signed)
 Patient has left leg rash on his inner thigh that is red, swollen, and uncomfortable. He first noticed it 1 week ago.  He also has large varicose vein noted going up whole leg. His significant other works in healthcare and recommended he get it checked out.

## 2024-04-18 ENCOUNTER — Emergency Department (HOSPITAL_COMMUNITY)

## 2024-04-18 ENCOUNTER — Emergency Department (HOSPITAL_COMMUNITY)
Admission: EM | Admit: 2024-04-18 | Discharge: 2024-04-18 | Disposition: A | Discharge status: Home / Self Care | Attending: Emergency Medicine | Admitting: Emergency Medicine

## 2024-04-18 ENCOUNTER — Encounter (HOSPITAL_COMMUNITY): Payer: Self-pay

## 2024-04-18 ENCOUNTER — Other Ambulatory Visit: Payer: Self-pay

## 2024-04-18 DIAGNOSIS — R0602 Shortness of breath: Secondary | ICD-10-CM | POA: Insufficient documentation

## 2024-04-18 DIAGNOSIS — F17210 Nicotine dependence, cigarettes, uncomplicated: Secondary | ICD-10-CM | POA: Diagnosis not present

## 2024-04-18 DIAGNOSIS — M79632 Pain in left forearm: Secondary | ICD-10-CM | POA: Insufficient documentation

## 2024-04-18 DIAGNOSIS — R079 Chest pain, unspecified: Secondary | ICD-10-CM | POA: Insufficient documentation

## 2024-04-18 DIAGNOSIS — M542 Cervicalgia: Secondary | ICD-10-CM | POA: Diagnosis not present

## 2024-04-18 DIAGNOSIS — Z794 Long term (current) use of insulin: Secondary | ICD-10-CM | POA: Insufficient documentation

## 2024-04-18 DIAGNOSIS — I10 Essential (primary) hypertension: Secondary | ICD-10-CM | POA: Diagnosis not present

## 2024-04-18 DIAGNOSIS — E1165 Type 2 diabetes mellitus with hyperglycemia: Secondary | ICD-10-CM | POA: Insufficient documentation

## 2024-04-18 DIAGNOSIS — R739 Hyperglycemia, unspecified: Secondary | ICD-10-CM

## 2024-04-18 LAB — CBC WITH DIFFERENTIAL/PLATELET
Abs Immature Granulocytes: 0.03 K/uL (ref 0.00–0.07)
Basophils Absolute: 0 K/uL (ref 0.0–0.1)
Basophils Relative: 1 %
Eosinophils Absolute: 0.3 K/uL (ref 0.0–0.5)
Eosinophils Relative: 4 %
HCT: 42.9 % (ref 39.0–52.0)
Hemoglobin: 15.1 g/dL (ref 13.0–17.0)
Immature Granulocytes: 0 %
Lymphocytes Relative: 21 %
Lymphs Abs: 1.4 K/uL (ref 0.7–4.0)
MCH: 30.8 pg (ref 26.0–34.0)
MCHC: 35.2 g/dL (ref 30.0–36.0)
MCV: 87.4 fL (ref 80.0–100.0)
Monocytes Absolute: 0.4 K/uL (ref 0.1–1.0)
Monocytes Relative: 6 %
Neutro Abs: 4.6 K/uL (ref 1.7–7.7)
Neutrophils Relative %: 68 %
Platelets: 259 K/uL (ref 150–400)
RBC: 4.91 MIL/uL (ref 4.22–5.81)
RDW: 11.9 % (ref 11.5–15.5)
WBC: 6.8 K/uL (ref 4.0–10.5)
nRBC: 0 % (ref 0.0–0.2)

## 2024-04-18 LAB — PRO BRAIN NATRIURETIC PEPTIDE: Pro Brain Natriuretic Peptide: <50 pg/mL

## 2024-04-18 LAB — CBG MONITORING, ED: Glucose-Capillary: 263 mg/dL — ABNORMAL HIGH (ref 70–99)

## 2024-04-18 LAB — COMPREHENSIVE METABOLIC PANEL WITH GFR
ALT: 18 U/L (ref 0–44)
AST: 16 U/L (ref 15–41)
Albumin: 4.3 g/dL (ref 3.5–5.0)
Alkaline Phosphatase: 122 U/L (ref 38–126)
Anion gap: 17 — ABNORMAL HIGH (ref 5–15)
BUN: 12 mg/dL (ref 6–20)
CO2: 22 mmol/L (ref 22–32)
Calcium: 9.4 mg/dL (ref 8.9–10.3)
Chloride: 95 mmol/L — ABNORMAL LOW (ref 98–111)
Creatinine, Ser: 0.92 mg/dL (ref 0.61–1.24)
GFR, Estimated: >60 mL/min
Glucose, Bld: 536 mg/dL (ref 70–99)
Potassium: 4.8 mmol/L (ref 3.5–5.1)
Sodium: 134 mmol/L — ABNORMAL LOW (ref 135–145)
Total Bilirubin: 0.4 mg/dL (ref 0.0–1.2)
Total Protein: 7.1 g/dL (ref 6.5–8.1)

## 2024-04-18 LAB — TROPONIN T, HIGH SENSITIVITY
Troponin T High Sensitivity: <15 ng/L (ref 0–19)
Troponin T High Sensitivity: <15 ng/L (ref 0–19)

## 2024-04-18 MED ORDER — IOHEXOL 350 MG/ML SOLN
75.0000 mL | Freq: Once | INTRAVENOUS | Status: AC | PRN
  Administered 2024-04-18: 75 mL via INTRAVENOUS

## 2024-04-18 MED ORDER — FREESTYLE LIBRE 3 PLUS SENSOR MISC
1.0000 | 2 refills | Status: AC
  Filled 2024-04-18: qty 2, 30d supply, fill #0

## 2024-04-18 MED ORDER — ALBUTEROL SULFATE HFA 108 (90 BASE) MCG/ACT IN AERS
1.0000 | INHALATION_SPRAY | Freq: Four times a day (QID) | RESPIRATORY_TRACT | 0 refills | Status: AC | PRN
  Filled 2024-04-18: qty 6.7, 20d supply, fill #0

## 2024-04-18 MED ORDER — SODIUM CHLORIDE 0.9 % IV BOLUS
1000.0000 mL | Freq: Once | INTRAVENOUS | Status: AC
  Administered 2024-04-18: 1000 mL via INTRAVENOUS

## 2024-04-18 MED ORDER — ASPIRIN 81 MG PO CHEW
324.0000 mg | CHEWABLE_TABLET | Freq: Once | ORAL | Status: AC
  Administered 2024-04-18: 324 mg via ORAL
  Filled 2024-04-18: qty 4

## 2024-04-18 MED ORDER — AZITHROMYCIN 250 MG PO TABS
250.0000 mg | ORAL_TABLET | Freq: Every day | ORAL | 0 refills | Status: AC
  Filled 2024-04-18: qty 6, 5d supply, fill #0

## 2024-04-18 MED ORDER — INSULIN ASPART 100 UNIT/ML IJ SOLN
8.0000 [IU] | Freq: Once | INTRAMUSCULAR | Status: AC
  Administered 2024-04-18: 8 [IU] via SUBCUTANEOUS
  Filled 2024-04-18: qty 1

## 2024-04-18 NOTE — ED Triage Notes (Signed)
 Pt arrived via POV c/o chest pain and SOB since Sunday night. Pt reports his BP has been elevated and reports pain does radiate to his jaw and left arm.

## 2024-04-18 NOTE — ED Notes (Signed)
 Pt has been 98-100% while on bed. Re did walk test and pt maintained 100% ra, ambulated 120 feet with no obvious shob. Pt states was just a little shob walking. Nad. Pa aware

## 2024-04-18 NOTE — ED Provider Notes (Cosign Needed Addendum)
 " [place] EMERGENCY DEPARTMENT AT [facility] Provider Note   CSN: [id number] Arrival date & time: 04/18/24  1412     Patient presents with: Chest Pain   Raymond Sanchez is a 49 y.o. male with a history including type 2 diabetes, prior history of hypertension which resolved after significant weight loss, distant history of cigarette smoking, quit 9 years ago presenting with a 5-day history of left-sided chest pain described as a pressure sensation which is constant but worsens with exertion, describing pressure that radiates into his left neck and skips his upper arm but has a pain in his left forearm with exertion.  He denies shortness of breath, has no pleuritic pain, no cough, fever, nausea vomiting or diaphoresis.  He woke with the symptoms 5 days ago and has found no alleviators.  Initially thought it might be acid reflux which he does have a history of, he has chewed Tums tablets for the past 5 days with no symptomatic relief.  Family history is negative for cardiac disease before age 1.   The history is provided by the patient.       Prior to Admission medications  Medication Sig Start Date End Date Taking? Authorizing Provider  albuterol  (VENTOLIN  HFA) 108 (90 Base) MCG/ACT inhaler Inhale 1-2 puffs into the lungs every 6 (six) hours as needed. 04/18/24  Yes Kallan Merrick, PA-C  azithromycin  (ZITHROMAX ) 250 MG tablet Take 1 tablet (250 mg total) by mouth daily. Take first 2 tablets together, then 1 every day until finished. 04/18/24  Yes Hazelynn Mckenny, Mliss, PA-C  Continuous Glucose Sensor (FREESTYLE LIBRE 3 PLUS SENSOR) MISC Place 1 Device onto the skin continuous for 14 days. 04/18/24 05/02/24 Yes Aaryanna Hyden, Mliss, PA-C  AMBULATORY NON FORMULARY MEDICATION Take 25 mg by mouth daily. Serophene  01/19/23   McKenzie, Belvie CROME, MD  AMBULATORY NON FORMULARY MEDICATION Medication Name: Serophene  50mg  Take half of tablet (25mg ) by mouth daily 01/19/23   McKenzie, Belvie CROME, MD  clomiPHENE  (CLOMID )  50 MG tablet Take 0.5 tablets (25 mg total) by mouth daily. Refills pending follow up appointment. 10/04/22   McKenzie, Belvie CROME, MD  Continuous Glucose Sensor (FREESTYLE LIBRE 2 SENSOR) MISC Apply 1 sensor onto the skin every 2 weeks 04/19/24     docusate sodium  (COLACE) 100 MG capsule Take 1 capsule (100 mg total) by mouth 2 (two) times daily. Patient not taking: Reported on 10/26/2021 04/17/21   Ricky Fines, MD  doxycycline  (VIBRAMYCIN ) 100 MG capsule Take 1 capsule (100 mg total) by mouth every 12 (twelve) hours. Patient not taking: Reported on 10/26/2021 05/01/21   Sherrilee Belvie CROME, MD  doxycycline  (VIBRAMYCIN ) 100 MG capsule Take 1 capsule (100 mg total) by mouth 2 (two) times daily. 02/13/24   Zackowski, Scott, MD  levocetirizine (XYZAL ) 5 MG tablet Take 1 tablet (5 mg total) by mouth every evening. 08/03/19   Iva Marty Saltness, MD  methocarbamol  (ROBAXIN ) 500 MG tablet Take 1 tablet (500 mg total) by mouth every 6 (six) hours as needed for muscle spasms. 11/14/23   Persons, Ronal Dragon, PA  methylPREDNISolone  (MEDROL  DOSEPAK) 4 MG TBPK tablet Take as directed with food 11/14/23   Persons, Ronal Dragon, PA  montelukast  (SINGULAIR ) 10 MG tablet Take 1 tablet (10 mg total) by mouth at bedtime. 08/03/19   Iva Marty Saltness, MD  MOUNJARO  2.5 MG/0.5ML Pen SMARTSIG:2.5 Milligram(s) SUB-Q Once a Week 04/27/21   [provider]  MOUNJARO  5 MG/0.5ML Pen Inject into the skin. Patient not  taking: Reported on 10/04/2022 07/20/21   [provider]  ondansetron  (ZOFRAN -ODT) 8 MG disintegrating tablet Take 1 tablet (8 mg total) by mouth every 8 (eight) hours as needed for nausea or vomiting. Patient not taking: Reported on 10/26/2021 04/17/21   Ricky Fines, MD  sulfamethoxazole -trimethoprim  (BACTRIM  DS) 800-160 MG tablet Take 1 tablet by mouth 2 (two) times daily. Patient not taking: Reported on 10/26/2021 04/17/21   Ricky Fines, MD  tamsulosin  (FLOMAX ) 0.4 MG CAPS capsule Take 1 capsule (0.4 mg  total) by mouth daily after supper. Patient not taking: Reported on 10/26/2021 04/10/21   Sherrilee Belvie CROME, MD  tirzepatide  (MOUNJARO ) 7.5 MG/0.5ML Pen Inject 7.5 mg into the skin once a week. 04/19/24     TRESIBA FLEXTOUCH 200 UNIT/ML FlexTouch Pen Inject 32 Units into the skin daily. 04/06/21   [provider]    Allergies: Patient has no known allergies.    Review of Systems  Constitutional:  Negative for chills and fever.  HENT:  Negative for congestion and sore throat.   Eyes: Negative.   Respiratory:  Negative for cough, chest tightness and shortness of breath.   Cardiovascular:  Positive for chest pain. Negative for palpitations and leg swelling.  Gastrointestinal:  Negative for abdominal pain, nausea and vomiting.  Genitourinary: Negative.   Musculoskeletal:  Positive for arthralgias and neck pain. Negative for joint swelling.  Skin: Negative.  Negative for rash and wound.  Neurological:  Negative for dizziness, weakness, light-headedness, numbness and headaches.  Psychiatric/Behavioral: Negative.    All other systems reviewed and are negative.   Updated Vital Signs BP (!) 147/91   Pulse 76   Temp 97.9 F (36.6 C) (Oral)   Resp 11   Ht 6' 6 (1.981 m)   Wt 124.7 kg   SpO2 100%   BMI 31.77 kg/m   Physical Exam Vitals and nursing note reviewed.  Constitutional:      Appearance: He is well-developed.  HENT:     Head: Normocephalic and atraumatic.  Eyes:     Conjunctiva/sclera: Conjunctivae normal.  Cardiovascular:     Rate and Rhythm: Normal rate and regular rhythm.     Heart sounds: Normal heart sounds.  Pulmonary:     Effort: Pulmonary effort is normal.     Breath sounds: Normal breath sounds. No wheezing.  Chest:     Chest wall: No tenderness.  Abdominal:     General: Bowel sounds are normal.     Palpations: Abdomen is soft.     Tenderness: There is no abdominal tenderness.  Musculoskeletal:        General: Normal range of motion.     Cervical  back: Normal range of motion.     Right lower leg: No edema.     Left lower leg: No edema.  Skin:    General: Skin is warm and dry.  Neurological:     Mental Status: He is alert.     (all labs ordered are listed, but only abnormal results are displayed) Labs Reviewed  COMPREHENSIVE METABOLIC PANEL WITH GFR - Abnormal; Notable for the following components:      Result Value   Sodium 134 (*)    Chloride 95 (*)    Glucose, Bld 536 (*)    Anion gap 17 (*)    All other components within normal limits  CBG MONITORING, ED - Abnormal; Notable for the following components:   Glucose-Capillary 263 (*)    All other components within normal limits  CBC  WITH DIFFERENTIAL/PLATELET  PRO BRAIN NATRIURETIC PEPTIDE  TROPONIN T, HIGH SENSITIVITY  TROPONIN T, HIGH SENSITIVITY    EKG: EKG Interpretation Date/Time:  Wednesday April 18 2024 14:39:44 EST Ventricular Rate:  94 PR Interval:  138 QRS Duration:  98 QT Interval:  340 QTC Calculation: 425 R Axis:   103  Text Interpretation: Normal sinus rhythm Rightward axis Borderline ECG Confirmed by Melvenia Motto [phone]) on 04/18/2024 6:39:28 PM  Radiology: CT Angio Chest PE W and/or Wo Contrast Result Date: 04/18/2024 EXAM: CTA of the Chest with contrast for PE 04/18/2024 07:06:40 PM TECHNIQUE: CTA of the chest was performed without and with the administration of 75 mL of iohexol  (OMNIPAQUE ) 350 MG/ML injection. Multiplanar reformatted images are provided for review. MIP images are provided for review. Automated exposure control, iterative reconstruction, and/or weight based adjustment of the mA/kV was utilized to reduce the radiation dose to as low as reasonably achievable. COMPARISON: None available. CLINICAL HISTORY: Pulmonary embolism (PE) suspected, high prob. FINDINGS: PULMONARY ARTERIES: Pulmonary arteries are adequately opacified for evaluation. No pulmonary embolism. Main pulmonary artery is normal in caliber. MEDIASTINUM: The heart and  pericardium demonstrate no acute abnormality. There is no acute abnormality of the thoracic aorta. LYMPH NODES: No mediastinal, hilar or axillary lymphadenopathy. LUNGS AND PLEURA: Subtle ground-glass nodular areas noted in the anterior left lower lobe, image 70 of series 10. These are likely related to early infectious or inflammatory process. These could be followed with repeat CT in 3 to 6 months to ensure resolution. No focal consolidation or pulmonary edema. No pleural effusion or pneumothorax. UPPER ABDOMEN: Limited images of the upper abdomen are unremarkable. SOFT TISSUES AND BONES: No acute bone or soft tissue abnormality. IMPRESSION: 1. No pulmonary embolism. 2. Subtle ground glass nodular areas in the anterior left lower lobe, likely related to early infectious or inflammatory process, with recommendation for follow-up chest CT in 3 to 6 months to ensure resolution. Electronically signed by: Franky Crease MD MD 04/18/2024 07:11 PM EST RP Workstation: [id number]   DG Chest 2 View Result Date: 04/18/2024 CLINICAL DATA:  Chest pain and shortness of breath. EXAM: CHEST - 2 VIEW COMPARISON:  01/02/2016 FINDINGS: Lungs are adequately inflated without focal airspace consolidation or effusion. Cardiomediastinal silhouette, bones and soft tissues are normal. IMPRESSION: No active cardiopulmonary disease. Electronically Signed   By: Toribio Agreste M.D.   On: 04/18/2024 15:09     Procedures   Medications Ordered in the ED  aspirin  chewable tablet 324 mg (324 mg Oral Given 04/18/24 1716)  sodium chloride  0.9 % bolus 1,000 mL (0 mLs Intravenous Stopped 04/18/24 1951)  sodium chloride  0.9 % bolus 1,000 mL (0 mLs Intravenous Stopped 04/18/24 1951)  insulin  aspart (novoLOG ) injection 8 Units (8 Units Subcutaneous Given 04/18/24 1716)  iohexol  (OMNIPAQUE ) 350 MG/ML injection 75 mL (75 mLs Intravenous Contrast Given 04/18/24 1853)                 Heart score of 3                    Medical Decision Making Patient  presenting with a 5-day history of chest pressure, shortness of breath with exertion, the pressure has been relatively constant, not worsened with activity or positional changes, not reproducible.  Differential diagnosis would include unstable angina, pneumonia, pulmonary embolism although he has no risk factors for PE, musculoskeletal.  Labs, imaging and exam are reassuring, especially a negative and flat delta troponin and no acute findings on EKG.  Given his reported shortness of breath he was ambulated in the department, his initial O2 sats dropped to 85% although he denied any shortness of breath.  CT angio was obtained to rule out PE after this finding.  Negative for acute PE.  He was ambulated again by his RN and he had no desaturation and no complaints of shortness of breath.  He was significant hyperglycemic upon arrival, he was given IV fluids along with subcu insulin , repeat glucose 263.  He did have a mild anion gap but a normal CO2, with improvement in his CBG I would anticipate that anion gap has also closed as well.  He has no symptoms suggesting DKA.  Amount and/or Complexity of Data Reviewed Labs: ordered.    Details: Labs reviewed, CMET significant for a sodium of 134, glucose 536, anion gap of 17, CO2 22, BNP normal range, CBC negative and troponin x 2 both negative. Radiology: ordered.    Details: Chest x-Raymond reviewed, no active cardiopulmonary disease.  CT angio and chest also reviewed, no PE.  There is a subtle ground glass nodular area anterior left lower lobe, either early infectious or inflammatory disease.  This was discussed with patient, he will be covered for possibility of early pneumonia, he was placed on a Z-Pak. ECG/medicine tests: ordered.    Details: Normal sinus rhythm rate 94  Risk OTC drugs. Prescription drug management. Risk Details: Additional medications provided including a refill of his albuterol  MDI at patient request, also requested refill for his freestyle  libre glucose sensors which was provided.  Patient was given an ambulatory referral to cardiology.  Also discussed recommendation of repeat CT imaging in 3 to 6 months to ensure resolution of pulmonary findings.        Final diagnoses:  Hyperglycemia  Chest pain, unspecified type    ED Discharge Orders          Ordered    albuterol  (VENTOLIN  HFA) 108 (90 Base) MCG/ACT inhaler  Every 6 hours PRN        04/18/24 2002    azithromycin  (ZITHROMAX ) 250 MG tablet  Daily        04/18/24 2002    Continuous Glucose Sensor (FREESTYLE LIBRE 3 PLUS SENSOR) MISC  Continuous       Note to Pharmacy: ok to change to 3 plus per dr 01/08   04/18/24 2002    Ambulatory referral to Cardiology        04/18/24 2002               Birdena Clarity, PA-C 04/18/24 1658    Birdena Clarity, PA-C 04/20/24 1444  "

## 2024-04-18 NOTE — Discharge Instructions (Signed)
 At discussed we have found no obvious reason for your symptoms today, but your CT scan of your chest suggest you may have a very early pneumonia/inflammation of an area of your left lung.  You are being placed on antibiotics, take the entire course of the Zithromax  prescribed.  You are also being referred to our local cardiologist for an office visit as discussed as well.  Expect a call from them within the next 1 to 2 days to arrange an office visit with them.  In the interim get rechecked for any new or worsening symptoms.

## 2024-04-18 NOTE — ED Notes (Signed)
 Pt ambulated around nurses station, O2 sat dropped from 98 to 83% and maintained at 85% during ambulation. Pt visibly dyspneic during ambulation and back in room.

## 2024-04-18 NOTE — ED Triage Notes (Signed)
 Pt reports he has gone through apprx a bottle of Tums thinking this was heartburn, but reports he has not had any relief.

## 2024-04-18 NOTE — ED Provider Notes (Incomplete Revision)
 " [place] EMERGENCY DEPARTMENT AT [facility] Provider Note   CSN: [id number] Arrival date & time: 04/18/24  1412     Patient presents with: Chest Pain   Ray OREY MOURE II is a 49 y.o. male with a history including type 2 diabetes, prior history of hypertension which resolved after significant weight loss, distant history of cigarette smoking, quit 9 years ago presenting with a 5-day history of left-sided chest pain described as a pressure sensation which is constant but worsens with exertion, describing pressure that radiates into his left neck and skips his upper arm but has a pain in his left forearm with exertion.  He denies shortness of breath, has no pleuritic pain, no cough, fever, nausea vomiting or diaphoresis.  He woke with the symptoms 5 days ago and has found no alleviators.  Initially thought it might be acid reflux which he does have a history of, he has chewed Tums tablets for the past 5 days with no symptomatic relief.  Family history is negative for cardiac disease before age 16.  {Add pertinent medical, surgical, social history, OB history to YEP:67052} The history is provided by the patient.       Prior to Admission medications  Medication Sig Start Date End Date Taking? Authorizing Provider  albuterol  (VENTOLIN  HFA) 108 (90 Base) MCG/ACT inhaler Inhale 1-2 puffs into the lungs every 6 (six) hours as needed for wheezing or shortness of breath. 03/28/21   [provider]  AMBULATORY NON FORMULARY MEDICATION Take 25 mg by mouth daily. Serophene  01/19/23   Sherrilee Belvie CROME, MD  AMBULATORY NON FORMULARY MEDICATION Medication Name: Serophene  50mg  Take half of tablet (25mg ) by mouth daily 01/19/23   McKenzie, Belvie CROME, MD  clomiPHENE  (CLOMID ) 50 MG tablet Take 0.5 tablets (25 mg total) by mouth daily. Refills pending follow up appointment. 10/04/22   McKenzie, Belvie CROME, MD  Continuous Blood Gluc Sensor (FREESTYLE LIBRE 2 SENSOR) MISC REPLACE AS DIRECTED  04/23/21   [provider]  docusate sodium  (COLACE) 100 MG capsule Take 1 capsule (100 mg total) by mouth 2 (two) times daily. Patient not taking: Reported on 10/26/2021 04/17/21   Ricky Fines, MD  doxycycline  (VIBRAMYCIN ) 100 MG capsule Take 1 capsule (100 mg total) by mouth every 12 (twelve) hours. Patient not taking: Reported on 10/26/2021 05/01/21   Sherrilee Belvie CROME, MD  doxycycline  (VIBRAMYCIN ) 100 MG capsule Take 1 capsule (100 mg total) by mouth 2 (two) times daily. 02/13/24   Zackowski, Scott, MD  levocetirizine (XYZAL ) 5 MG tablet Take 1 tablet (5 mg total) by mouth every evening. 08/03/19   Iva Marty Saltness, MD  methocarbamol  (ROBAXIN ) 500 MG tablet Take 1 tablet (500 mg total) by mouth every 6 (six) hours as needed for muscle spasms. 11/14/23   Persons, Ronal Dragon, PA  methylPREDNISolone  (MEDROL  DOSEPAK) 4 MG TBPK tablet Take as directed with food 11/14/23   Persons, Ronal Dragon, [place]  montelukast  (SINGULAIR ) 10 MG tablet Take 1 tablet (10 mg total) by mouth at bedtime. 08/03/19   Iva Marty Saltness, MD  MOUNJARO 2.5 MG/0.5ML Pen SMARTSIG:2.5 Milligram(s) SUB-Q Once a Week 04/27/21   [provider]  MOUNJARO 5 MG/0.5ML Pen Inject into the skin. Patient not taking: Reported on 10/04/2022 07/20/21   [provider]  ondansetron  (ZOFRAN -ODT) 8 MG disintegrating tablet Take 1 tablet (8 mg total) by mouth every 8 (eight) hours as needed for nausea or vomiting. Patient not taking: Reported on 10/26/2021 04/17/21   Ricky Fines,  MD  sulfamethoxazole -trimethoprim  (BACTRIM  DS) 800-160 MG tablet Take 1 tablet by mouth 2 (two) times daily. Patient not taking: Reported on 10/26/2021 04/17/21   Ricky Fines, MD  tamsulosin  (FLOMAX ) 0.4 MG CAPS capsule Take 1 capsule (0.4 mg total) by mouth daily after supper. Patient not taking: Reported on 10/26/2021 04/10/21   Sherrilee Belvie CROME, MD  TRESIBA FLEXTOUCH 200 UNIT/ML FlexTouch Pen Inject 32 Units into the skin daily. 04/06/21    [provider]    Allergies: Patient has no known allergies.    Review of Systems  Constitutional:  Negative for chills and fever.  HENT:  Negative for congestion and sore throat.   Eyes: Negative.   Respiratory:  Negative for cough, chest tightness and shortness of breath.   Cardiovascular:  Positive for chest pain. Negative for palpitations and leg swelling.  Gastrointestinal:  Negative for abdominal pain, nausea and vomiting.  Genitourinary: Negative.   Musculoskeletal:  Positive for arthralgias and neck pain. Negative for joint swelling.  Skin: Negative.  Negative for rash and wound.  Neurological:  Negative for dizziness, weakness, light-headedness, numbness and headaches.  Psychiatric/Behavioral: Negative.    All other systems reviewed and are negative.   Updated Vital Signs BP (!) 152/83   Pulse 92   Temp 98.4 F (36.9 C) (Temporal)   Resp 12   Ht 6' 6 (1.981 m)   Wt 124.7 kg   SpO2 97%   BMI 31.77 kg/m   Physical Exam Vitals and nursing note reviewed.  Constitutional:      Appearance: He is well-developed.  HENT:     Head: Normocephalic and atraumatic.  Eyes:     Conjunctiva/sclera: Conjunctivae normal.  Cardiovascular:     Rate and Rhythm: Normal rate and regular rhythm.     Heart sounds: Normal heart sounds.  Pulmonary:     Effort: Pulmonary effort is normal.     Breath sounds: Normal breath sounds. No wheezing.  Chest:     Chest wall: No tenderness.  Abdominal:     General: Bowel sounds are normal.     Palpations: Abdomen is soft.     Tenderness: There is no abdominal tenderness.  Musculoskeletal:        General: Normal range of motion.     Cervical back: Normal range of motion.     Right lower leg: No edema.     Left lower leg: No edema.  Skin:    General: Skin is warm and dry.  Neurological:     Mental Status: He is alert.     (all labs ordered are listed, but only abnormal results are displayed) Labs Reviewed  COMPREHENSIVE  METABOLIC PANEL WITH GFR - Abnormal; Notable for the following components:      Result Value   Sodium 134 (*)    Chloride 95 (*)    Glucose, Bld 536 (*)    Anion gap 17 (*)    All other components within normal limits  CBC WITH DIFFERENTIAL/PLATELET  TROPONIN T, HIGH SENSITIVITY  TROPONIN T, HIGH SENSITIVITY    EKG: None  Radiology: DG Chest 2 View Result Date: 04/18/2024 CLINICAL DATA:  Chest pain and shortness of breath. EXAM: CHEST - 2 VIEW COMPARISON:  01/02/2016 FINDINGS: Lungs are adequately inflated without focal airspace consolidation or effusion. Cardiomediastinal silhouette, bones and soft tissues are normal. IMPRESSION: No active cardiopulmonary disease. Electronically Signed   By: Toribio Agreste M.D.   On: 04/18/2024 15:09    {Document cardiac monitor, telemetry assessment procedure  when appropriate:32947} Procedures   Medications Ordered in the ED  aspirin  chewable tablet 324 mg (324 mg Oral Given 04/18/24 1716)  sodium chloride  0.9 % bolus 1,000 mL (1,000 mLs Intravenous New Bag/Given 04/18/24 1708)  sodium chloride  0.9 % bolus 1,000 mL (1,000 mLs Intravenous New Bag/Given 04/18/24 1709)  insulin  aspart (novoLOG ) injection 8 Units (8 Units Subcutaneous Given 04/18/24 1716)      {Click here for ABCD2, HEART and other calculators REFRESH Note before signing:1}          HEART Score: 3                    Medical Decision Making Amount and/or Complexity of Data Reviewed Labs: ordered. Radiology: ordered.  Risk OTC drugs. Prescription drug management.     {Document critical care time when appropriate  Document review of labs and clinical decision tools ie CHADS2VASC2, etc  Document your independent review of radiology images and any outside records  Document your discussion with family members, caretakers and with consultants  Document social determinants of health affecting pt's care  Document your decision making why or why not admission, treatments were  needed:32947:::1}   Final diagnoses:  Hyperglycemia    ED Discharge Orders     None          Birdena Clarity, DEVONNA 04/18/24 1658  "

## 2024-04-19 ENCOUNTER — Other Ambulatory Visit (HOSPITAL_BASED_OUTPATIENT_CLINIC_OR_DEPARTMENT_OTHER): Payer: Self-pay

## 2024-04-19 MED ORDER — FREESTYLE LIBRE 2 SENSOR MISC
9 refills | Status: AC
  Filled 2024-04-19: qty 2, 28d supply, fill #0

## 2024-04-19 MED ORDER — MOUNJARO 7.5 MG/0.5ML ~~LOC~~ SOAJ
7.5000 mg | SUBCUTANEOUS | 3 refills | Status: AC
  Filled 2024-04-19: qty 2, 28d supply, fill #0

## 2024-04-20 ENCOUNTER — Other Ambulatory Visit (HOSPITAL_BASED_OUTPATIENT_CLINIC_OR_DEPARTMENT_OTHER): Payer: Self-pay

## 2024-04-20 MED ORDER — DEXCOM G7 SENSOR MISC
5 refills | Status: AC
  Filled 2024-04-20: qty 3, 30d supply, fill #0

## 2024-04-20 MED ORDER — FREESTYLE LIBRE 3 PLUS SENSOR MISC
11 refills | Status: AC
  Filled 2024-04-20: qty 2, 30d supply, fill #0

## 2024-04-23 ENCOUNTER — Other Ambulatory Visit (HOSPITAL_BASED_OUTPATIENT_CLINIC_OR_DEPARTMENT_OTHER): Payer: Self-pay

## 2024-05-01 ENCOUNTER — Encounter: Payer: Self-pay | Admitting: Physician Assistant
